# Patient Record
Sex: Female | Born: 1995 | Race: Black or African American | Hispanic: No | Marital: Single | Smoking: Current some day smoker
Health system: Southern US, Community
[De-identification: ages and names within clinical notes are randomized; demographics above are authoritative.]

## PROBLEM LIST (undated history)

## (undated) DIAGNOSIS — J982 Interstitial emphysema: Secondary | ICD-10-CM

## (undated) DIAGNOSIS — F1010 Alcohol abuse, uncomplicated: Secondary | ICD-10-CM

## (undated) DIAGNOSIS — F319 Bipolar disorder, unspecified: Secondary | ICD-10-CM

## (undated) DIAGNOSIS — F4310 Post-traumatic stress disorder, unspecified: Secondary | ICD-10-CM

## (undated) HISTORY — DX: Alcohol abuse, uncomplicated: F10.10

## (undated) HISTORY — DX: Interstitial emphysema: J98.2

---
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
## 2020-10-17 ENCOUNTER — Ambulatory Visit (HOSPITAL_COMMUNITY)
Admission: EM | Admit: 2020-10-17 | Discharge: 2020-10-17 | Disposition: A | Discharge status: Home / Self Care | Payer: Self-pay | Attending: Family Medicine | Admitting: Family Medicine

## 2020-10-17 ENCOUNTER — Encounter (HOSPITAL_COMMUNITY): Payer: Self-pay | Admitting: Emergency Medicine

## 2020-10-17 DIAGNOSIS — N898 Other specified noninflammatory disorders of vagina: Secondary | ICD-10-CM | POA: Insufficient documentation

## 2020-10-17 DIAGNOSIS — N760 Acute vaginitis: Secondary | ICD-10-CM | POA: Insufficient documentation

## 2020-10-17 LAB — HIV ANTIBODY (ROUTINE TESTING W REFLEX): HIV Screen 4th Generation wRfx: NONREACTIVE

## 2020-10-17 MED ORDER — HIBICLENS 4 % EX LIQD: Freq: Every day | CUTANEOUS | 0 refills | Status: DC | PRN

## 2020-10-17 MED ORDER — LIDOCAINE VISCOUS HCL 2 % MT SOLN: OROMUCOSAL | 0 refills | Status: DC

## 2020-10-17 NOTE — ED Triage Notes (Signed)
Pt presents with vaginal pain, itching and burnign. States has white spots and "ulcers" in vaginal area. Denies discharge.

## 2020-10-17 NOTE — ED Provider Notes (Signed)
[facility]    CSN: [id number] Arrival date & time: 10/17/20  1208      History   Chief Complaint Chief Complaint  Patient presents with  . Vaginitis    HPI Diane Dickson is a 24 y.o. female.   Presenting today with vaginal sores, irritation and discharge for the past 3 days or so. States this has happened once before and eventually with good hygiene they went away. Denies known hx of STIs, new exposures. Has not tried anything OTC for sxs. Denies fever, chills, abdominal pain, flank pain, urinary sxs.      History reviewed. No pertinent past medical history.  There are no problems to display for this patient.   History reviewed. No pertinent surgical history.  OB History   No obstetric history on file.      Home Medications    Prior to Admission medications   Medication Sig Start Date End Date Taking? Authorizing Provider  chlorhexidine (HIBICLENS) 4 % external liquid Apply topically daily as needed. 10/17/20   Particia Nearing, PA-C  lidocaine (XYLOCAINE) 2 % solution Dab with cotton swab to external areas of pain in vaginal area as needed 10/17/20   Particia Nearing, PA-C    Family History Family History  Problem Relation Age of Onset  . Healthy Mother     Social History Social History   Tobacco Use  . Smoking status: Never Smoker  . Smokeless tobacco: Never Used  Substance Use Topics  . Alcohol use: Yes    Comment: daily  . Drug use: Never     Allergies   Patient has no allergy information on record.   Review of Systems Review of Systems PER HPI    Physical Exam Triage Vital Signs ED Triage Vitals  Enc Vitals Group     BP 10/17/20 1314 (!) 153/105     Pulse Rate 10/17/20 1314 74     Resp 10/17/20 1314 16     Temp 10/17/20 1314 98.9 F (37.2 C)     Temp Source 10/17/20 1314 Oral     SpO2 10/17/20 1314 97 %     Weight 10/17/20 1312 140 lb (63.5 kg)     Height 10/17/20 1312 5\' 1"  (1.549 m)     Head  Circumference --      Peak Flow --      Pain Score 10/17/20 1311 8     Pain Loc --      Pain Edu? --      Excl. in GC? --    No data found.  Updated Vital Signs BP (!) 153/105 (BP Location: Left Arm)   Pulse 74   Temp 98.9 F (37.2 C) (Oral)   Resp 16   Ht 5\' 1"  (1.549 m)   Wt 140 lb (63.5 kg)   LMP 09/25/2020   SpO2 97%   BMI 26.45 kg/m   Visual Acuity Right Eye Distance:   Left Eye Distance:   Bilateral Distance:    Right Eye Near:   Left Eye Near:    Bilateral Near:     Physical Exam Vitals and nursing note reviewed. Exam conducted with a chaperone present.  Constitutional:      Appearance: Normal appearance. She is not ill-appearing.  HENT:     Head: Atraumatic.  Eyes:     Extraocular Movements: Extraocular movements intact.     Conjunctiva/sclera: Conjunctivae normal.  Cardiovascular:     Rate and Rhythm: Normal rate and regular rhythm.  Heart sounds: Normal heart sounds.  Pulmonary:     Effort: Pulmonary effort is normal.     Breath sounds: Normal breath sounds.  Abdominal:     General: Bowel sounds are normal. There is no distension.     Palpations: Abdomen is soft.     Tenderness: There is no abdominal tenderness. There is no guarding.  Genitourinary:    Comments: Several pustules on gluteal and inner thigh/groin fold areas, several ulcerations on perineal and external labial regions b/l and internal vaginal canal erythematous, significantly tender to speculum exam. Mild discharge present Musculoskeletal:        General: Normal range of motion.     Cervical back: Normal range of motion and neck supple.  Skin:    General: Skin is warm and dry.  Neurological:     Mental Status: She is alert and oriented to person, place, and time.  Psychiatric:        Mood and Affect: Mood normal.        Thought Content: Thought content normal.        Judgment: Judgment normal.      UC Treatments / Results  Labs (all labs ordered are listed, but only  abnormal results are displayed) Labs Reviewed  HSV CULTURE AND TYPING  HIV ANTIBODY (ROUTINE TESTING W REFLEX)  RPR  CERVICOVAGINAL ANCILLARY ONLY    EKG   Radiology No results found.  Procedures Procedures (including critical care time)  Medications Ordered in UC Medications - No data to display  Initial Impression / Assessment and Plan / UC Course  I have reviewed the triage vital signs and the nursing notes.  Pertinent labs & imaging results that were available during my care of the patient were reviewed by me and considered in my medical decision making (see chart for details).     Full sti panel and HSV culture sent, will treat external pustules with hibiclens, warm compresses, good hygiene and lidocaine soln sent for prn pain relief. Return precautions reviewed  Final Clinical Impressions(s) / UC Diagnoses   Final diagnoses:  Acute vaginitis  Vaginal lesion   Discharge Instructions   None    ED Prescriptions    Medication Sig Dispense Auth. Provider   chlorhexidine (HIBICLENS) 4 % external liquid Apply topically daily as needed. 120 mL Particia Nearing, PA-C   lidocaine (XYLOCAINE) 2 % solution Dab with cotton swab to external areas of pain in vaginal area as needed 100 mL Particia Nearing, PA-C     PDMP not reviewed this encounter.   Particia Nearing, [place] 10/17/20 1437

## 2020-10-18 LAB — CERVICOVAGINAL ANCILLARY ONLY
Bacterial Vaginitis (gardnerella): POSITIVE — AB
Candida Glabrata: NEGATIVE
Candida Vaginitis: NEGATIVE
Chlamydia: NEGATIVE
Comment: NEGATIVE
Comment: NEGATIVE
Comment: NEGATIVE
Comment: NEGATIVE
Comment: NEGATIVE
Comment: NORMAL
Neisseria Gonorrhea: NEGATIVE
Trichomonas: NEGATIVE

## 2020-10-18 LAB — RPR: RPR Ser Ql: NONREACTIVE

## 2020-10-19 ENCOUNTER — Telehealth (HOSPITAL_COMMUNITY): Payer: Self-pay | Admitting: Emergency Medicine

## 2020-10-19 MED ORDER — METRONIDAZOLE 500 MG PO TABS: 500.0000 mg | ORAL_TABLET | Freq: Two times a day (BID) | ORAL | 0 refills | Status: DC

## 2020-10-20 ENCOUNTER — Telehealth (HOSPITAL_COMMUNITY): Payer: Self-pay

## 2020-10-20 LAB — HSV CULTURE AND TYPING

## 2020-10-20 MED ORDER — VALACYCLOVIR HCL 1 G PO TABS: 1000.0000 mg | ORAL_TABLET | Freq: Two times a day (BID) | ORAL | 0 refills | Status: AC

## 2020-10-20 NOTE — Telephone Encounter (Signed)
Pt called concerning if the medication we gave her would cause her to have vaginal bleeding. This RN advised her that it would not, the sores that are present could lead some blood tinged fluid present. Patient states that the blood is coming from her vagina and is heavy. This RN advised her that if she is soaking through a tampon or pad every hour, feeling fatigue, weakness, or dizziness she needs to go to the Emergency Department and she should call her OBGYN regarding the bleeding she is experiencing.   PT is positive for HSV2, this RN will call in medication per protocol.

## 2020-12-03 ENCOUNTER — Encounter (HOSPITAL_COMMUNITY): Payer: Self-pay | Admitting: Emergency Medicine

## 2020-12-03 ENCOUNTER — Emergency Department (HOSPITAL_COMMUNITY)
Admission: EM | Admit: 2020-12-03 | Discharge: 2020-12-03 | Disposition: A | Discharge status: Home / Self Care | Payer: Self-pay | Attending: Emergency Medicine | Admitting: Emergency Medicine

## 2020-12-03 ENCOUNTER — Other Ambulatory Visit: Payer: Self-pay

## 2020-12-03 DIAGNOSIS — R748 Abnormal levels of other serum enzymes: Secondary | ICD-10-CM | POA: Insufficient documentation

## 2020-12-03 DIAGNOSIS — F10929 Alcohol use, unspecified with intoxication, unspecified: Secondary | ICD-10-CM | POA: Insufficient documentation

## 2020-12-03 DIAGNOSIS — F10920 Alcohol use, unspecified with intoxication, uncomplicated: Secondary | ICD-10-CM

## 2020-12-03 DIAGNOSIS — Z20822 Contact with and (suspected) exposure to covid-19: Secondary | ICD-10-CM | POA: Insufficient documentation

## 2020-12-03 DIAGNOSIS — R45851 Suicidal ideations: Secondary | ICD-10-CM | POA: Insufficient documentation

## 2020-12-03 LAB — COMPREHENSIVE METABOLIC PANEL
ALT: 231 U/L — ABNORMAL HIGH (ref 0–44)
AST: 226 U/L — ABNORMAL HIGH (ref 15–41)
Albumin: 3.9 g/dL (ref 3.5–5.0)
Alkaline Phosphatase: 55 U/L (ref 38–126)
Anion gap: 14 (ref 5–15)
BUN: <5 mg/dL — ABNORMAL LOW (ref 6–20)
CO2: 29 mmol/L (ref 22–32)
Calcium: 9 mg/dL (ref 8.9–10.3)
Chloride: 99 mmol/L (ref 98–111)
Creatinine, Ser: 0.78 mg/dL (ref 0.44–1.00)
GFR, Estimated: >60 mL/min (ref >60)
Glucose, Bld: 103 mg/dL — ABNORMAL HIGH (ref 70–99)
Potassium: 3 mmol/L — ABNORMAL LOW (ref 3.5–5.1)
Sodium: 142 mmol/L (ref 135–145)
Total Bilirubin: 0.4 mg/dL (ref 0.3–1.2)
Total Protein: 8.4 g/dL — ABNORMAL HIGH (ref 6.5–8.1)

## 2020-12-03 LAB — CBC
HCT: 41.6 % (ref 36.0–46.0)
Hemoglobin: 14.1 g/dL (ref 12.0–15.0)
MCH: 33.3 pg (ref 26.0–34.0)
MCHC: 33.9 g/dL (ref 30.0–36.0)
MCV: 98.3 fL (ref 80.0–100.0)
Platelets: 232 10*3/uL (ref 150–400)
RBC: 4.23 MIL/uL (ref 3.87–5.11)
RDW: 13.2 % (ref 11.5–15.5)
WBC: 5.5 10*3/uL (ref 4.0–10.5)
nRBC: 0 % (ref 0.0–0.2)

## 2020-12-03 LAB — RAPID URINE DRUG SCREEN, HOSP PERFORMED
Amphetamines: NOT DETECTED
Barbiturates: NOT DETECTED
Benzodiazepines: NOT DETECTED
Cocaine: NOT DETECTED
Opiates: NOT DETECTED
Tetrahydrocannabinol: NOT DETECTED

## 2020-12-03 LAB — HEPATITIS PANEL, ACUTE
HCV Ab: NONREACTIVE
Hep A IgM: NONREACTIVE
Hep B C IgM: NONREACTIVE
Hepatitis B Surface Ag: NONREACTIVE

## 2020-12-03 LAB — I-STAT BETA HCG BLOOD, ED (MC, WL, AP ONLY): I-stat hCG, quantitative: <5 m[IU]/mL (ref <5)

## 2020-12-03 LAB — ACETAMINOPHEN LEVEL: Acetaminophen (Tylenol), Serum: <10 ug/mL — ABNORMAL LOW (ref 10–30)

## 2020-12-03 LAB — RESP PANEL BY RT-PCR (FLU A&B, COVID) ARPGX2
Influenza A by PCR: NEGATIVE
Influenza B by PCR: NEGATIVE
SARS Coronavirus 2 by RT PCR: NEGATIVE

## 2020-12-03 LAB — SALICYLATE LEVEL: Salicylate Lvl: <7 mg/dL — ABNORMAL LOW (ref 7.0–30.0)

## 2020-12-03 LAB — ETHANOL: Alcohol, Ethyl (B): 516 mg/dL (ref <10)

## 2020-12-03 MED ORDER — THIAMINE HCL 100 MG/ML IJ SOLN
Freq: Once | INTRAVENOUS | Status: AC
  Filled 2020-12-03: qty 1000

## 2020-12-03 MED ORDER — SODIUM CHLORIDE 0.9 % IV BOLUS: 1000.0000 mL | Freq: Once | INTRAVENOUS | Status: DC

## 2020-12-03 MED ORDER — POTASSIUM CHLORIDE CRYS ER 20 MEQ PO TBCR
40.0000 meq | EXTENDED_RELEASE_TABLET | Freq: Once | ORAL | Status: AC
  Administered 2020-12-03: 40 meq via ORAL
  Filled 2020-12-03: qty 2

## 2020-12-03 MED ORDER — SODIUM CHLORIDE 0.9 % IV BOLUS
1000.0000 mL | Freq: Once | INTRAVENOUS | Status: AC
  Administered 2020-12-03: 1000 mL via INTRAVENOUS

## 2020-12-03 NOTE — ED Notes (Signed)
Diane Dickson [phone] asking to be contacted with updates.

## 2020-12-03 NOTE — Discharge Instructions (Addendum)
As discussed, your liver enzymes were elevated today. Please have them rechecked in a week by a primary care provider. I have included resources in the community to help with your alcohol consumption. Peer support should be contacting you as well for additional resources. Return to the ER for new or worsening symptoms.

## 2020-12-03 NOTE — ED Triage Notes (Addendum)
Patient brought in by friends after ETOH consumption with patient expressing suicidal ideations with plan to overdose on medications. Patient reports a friend recently committed suicide. Patient is currently intoxicated.

## 2020-12-03 NOTE — ED Notes (Signed)
Pt speaking to mother. Will continue to monitor

## 2020-12-03 NOTE — ED Provider Notes (Signed)
EtOH, hx of abuse. Friend dropped her off due to SI, however denies SI on evaluation. Plan to re-evaluate once metabolized for any persisting SI. If negative, can discharge. Physical Exam  BP 125/90   Pulse (!) 111   Temp 98.4 F (36.9 C) (Oral)   Resp 18   Ht 5' (1.524 m)   Wt 59 kg   SpO2 100%   BMI 25.39 kg/m   Physical Exam Vitals and nursing note reviewed.  Constitutional:      Appearance: She is well-developed and well-nourished.  HENT:     Head: Normocephalic and atraumatic.  Eyes:     Conjunctiva/sclera: Conjunctivae normal.  Cardiovascular:     Rate and Rhythm: Normal rate.  Pulmonary:     Effort: Pulmonary effort is normal.  Abdominal:     Palpations: Abdomen is soft.  Skin:    General: Skin is warm.  Neurological:     Mental Status: She is alert and oriented to person, place, and time.     Comments: Steady gait  Psychiatric:        Mood and Affect: Mood and affect normal.        Behavior: Behavior normal.     ED Course/Procedures   Clinical Course as of 12/03/20 1819  Fri Dec 03, 2020  1507 Alcohol, Ethyl (B)(!!): 516 [CA]  1507 AST(!): 226 [CA]  1507 ALT(!): 231 [CA]  1545 Patient visualized ambulating in the hallway with steady gait, though still disoriented [JR]    Clinical Course User Index [CA] Mannie Stabile, PA-C [JR] Sayre Witherington, [place] N, PA-C    Procedures  MDM  On reevaluation, patient is alert and oriented.  She has normal speech and behavior.  She states she was unaware that she was expressing suicidal ideations.  She does not feel suicidal or homicidal.  She states it must of been the alcohol intoxication.  She endorses daily heavy drinking.  She states some of her "party friends" brought her here.  She does not wish to complete banana bag or wait for hepatitis panel results.   Had long discussion with patient regarding alcohol abuse.  Discussed her transaminitis, as this is likely secondary to her heavy alcohol use.  Patient  states she was unaware that her blood work could show evidence of her drinking habits.  Discussed these concerning findings and concern for her health with her alcohol dependence and potential future complications with her liver if she continues the heavy alcohol use.  Patient asked if it was "too late to change."  Provided additional counseling.  Consult placed to peer support and she is given substance abuse counseling resources in her discharge paperwork.  Discussed potential dangers associated with acute alcohol withdrawal and recommendation to gradually taper down her daily usage of alcohol.  She is discharged in no acute distress with steady gait.  PCP referral provided.    Murphy Bundick, [place] N, PA-C 12/03/20 Silva Bandy    Rolan Bucco, MD 12/06/20 [phone]

## 2020-12-03 NOTE — ED Provider Notes (Signed)
[facility] EMERGENCY DEPARTMENT Provider Note   CSN: [id number] Arrival date & time: 12/03/20  1218     History Chief Complaint  Patient presents with  . Medical Clearance    Diane Dickson is a 25 y.o. female with no significant past medical history presents to the ED due to suicidal ideations.  Patient brought in by friend after alcohol consumption due to thoughts of suicide.  Patient told friend that she planned on overdosing on medications.  Patient intoxicated during my initial evaluation, so history is limited.  Patient denies SI, HI, auditory/visual hallucinations.  Patient states she drinks "a lot" of alcohol daily.  She drank alcohol earlier this morning however, is unable to tell me how much.  No documented mental illnesses per chart review.  Per triage note, patient's friend recently committed suicide.  Denies any physical complaints.  No aggravating or alleviating factors  History obtained from patient and past medical records. No interpreter used during encounter.      History reviewed. No pertinent past medical history.  There are no problems to display for this patient.   History reviewed. No pertinent surgical history.   OB History   No obstetric history on file.     Family History  Problem Relation Age of Onset  . Healthy Mother     Social History   Tobacco Use  . Smoking status: Never Smoker  . Smokeless tobacco: Never Used  Substance Use Topics  . Alcohol use: Yes    Comment: daily  . Drug use: Never    Home Medications Prior to Admission medications   Medication Sig Start Date End Date Taking? Authorizing Provider  chlorhexidine (HIBICLENS) 4 % external liquid Apply topically daily as needed. 10/17/20   Particia Nearing, PA-C  lidocaine (XYLOCAINE) 2 % solution Dab with cotton swab to external areas of pain in vaginal area as needed 10/17/20   Particia Nearing, PA-C  metroNIDAZOLE (FLAGYL) 500 MG tablet Take 1  tablet (500 mg total) by mouth 2 (two) times daily. 10/19/20   Lamptey, Britta Mccreedy, MD    Allergies    Patient has no known allergies.  Review of Systems   Review of Systems  Constitutional: Negative for chills and fever.  HENT: Negative for rhinorrhea and sore throat.   Eyes: Negative for visual disturbance.  Respiratory: Negative for shortness of breath.   Cardiovascular: Negative for chest pain and palpitations.  Gastrointestinal: Negative for abdominal pain.  Genitourinary: Negative for dysuria.  Musculoskeletal: Negative for myalgias.  Skin: Negative for color change and rash.  Neurological: Negative for dizziness and light-headedness.  Psychiatric/Behavioral: Positive for suicidal ideas. Negative for hallucinations and self-injury.    Physical Exam Updated Vital Signs BP 125/90   Pulse (!) 111   Temp 98.4 F (36.9 C) (Oral)   Resp 18   Ht 5' (1.524 m)   Wt 59 kg   SpO2 100%   BMI 25.39 kg/m   Physical Exam Vitals and nursing note reviewed.  Constitutional:      General: She is not in acute distress.    Appearance: She is not ill-appearing.     Comments: Appears to be intoxicated  HENT:     Head: Normocephalic.  Eyes:     Pupils: Pupils are equal, round, and reactive to light.  Cardiovascular:     Rate and Rhythm: Normal rate and regular rhythm.     Pulses: Normal pulses.     Heart sounds: Normal heart sounds.  No murmur heard. No friction rub. No gallop.   Pulmonary:     Effort: Pulmonary effort is normal.     Breath sounds: Normal breath sounds.  Abdominal:     General: Abdomen is flat. Bowel sounds are normal. There is no distension.     Palpations: Abdomen is soft.     Tenderness: There is no abdominal tenderness. There is no guarding or rebound.  Musculoskeletal:        General: Normal range of motion.     Cervical back: Neck supple.  Skin:    General: Skin is warm and dry.  Neurological:     General: No focal deficit present.  Psychiatric:         Thought Content: Thought content includes suicidal ideation.     ED Results / Procedures / Treatments   Labs (all labs ordered are listed, but only abnormal results are displayed) Labs Reviewed  COMPREHENSIVE METABOLIC PANEL - Abnormal; Notable for the following components:      Result Value   Potassium 3.0 (*)    Glucose, Bld 103 (*)    BUN <5 (*)    Total Protein 8.4 (*)    AST 226 (*)    ALT 231 (*)    All other components within normal limits  ETHANOL - Abnormal; Notable for the following components:   Alcohol, Ethyl (B) 516 (*)    All other components within normal limits  SALICYLATE LEVEL - Abnormal; Notable for the following components:   Salicylate Lvl <7.0 (*)    All other components within normal limits  ACETAMINOPHEN LEVEL - Abnormal; Notable for the following components:   Acetaminophen (Tylenol), Serum <10 (*)    All other components within normal limits  RESP PANEL BY RT-PCR (FLU A&B, COVID) ARPGX2  CBC  RAPID URINE DRUG SCREEN, HOSP PERFORMED  HEPATITIS PANEL, ACUTE  I-STAT BETA HCG BLOOD, ED ([facility], [facility], [facility] ONLY)    EKG None  Radiology No results found.  Procedures Procedures (including critical care time)  Medications Ordered in ED Medications  potassium chloride SA (KLOR-CON) CR tablet 40 mEq (has no administration in time range)  sodium chloride 0.9 % 1,000 mL with thiamine 100 mg, folic acid 1 mg, multivitamins adult 10 mL infusion (has no administration in time range)  sodium chloride 0.9 % bolus 1,000 mL (1,000 mLs Intravenous New Bag/Given 12/03/20 1412)    ED Course  I have reviewed the triage vital signs and the nursing notes.  Pertinent labs & imaging results that were available during my care of the patient were reviewed by me and considered in my medical decision making (see chart for details).  Clinical Course as of 12/03/20 1527  Fri Dec 03, 2020  1507 Alcohol, Ethyl (B)(!!): 516 [CA]  1507 AST(!): 226 [CA]  1507 ALT(!): 231 [CA]     Clinical Course User Index [CA] Audelia Acton Merla Riches, PA-C   MDM Rules/Calculators/A&P                         25 year old female who presents to the ED after suicidal ideations with a plan to overdose on medications.  No previous psych history.  Patient admits to daily alcohol consumption and is visibly intoxicated during my initial evaluation.  Patient is here voluntarily.  Stable vitals.  Patient mildly tachycardic likely due to intoxication.  IV fluids given.  Medical clearance labs ordered.  CBC unremarkable no leukocytosis and normal hemoglobin.  Negative pregnancy test.  Ethanol level elevated at 516.  Acetaminophen and salicylate level within normal limits.  CBC unremarkable no leukocytosis and normal hemoglobin.  CMP significant for hypokalemia 3.  Potassium repleted here in the ED.  AST and ALT elevated (AST 226; ALT 231). Patient has no RUQ tenderness.  Low suspicion for gallbladder etiology.  Hepatitis panel ordered.  We will have patient follow-up with PCP to recheck LFTs to ensure they are downtrending.  Patient handed off to [place], PA-C at shift change pending clinical sobriety. Once patient is sober enough to give a history, inquire about SI/HI and consult TTS as needed.  Final Clinical Impression(s) / ED Diagnoses Final diagnoses:  Alcoholic intoxication without complication (HCC)  Suicidal ideations  Elevated liver enzymes    Rx / DC Orders ED Discharge Orders    None       Jesusita Oka 12/03/20 1527    Rolan Bucco, MD 12/06/20 [phone]

## 2020-12-03 NOTE — ED Notes (Signed)
Pt urinated in triage room floor while putting on scrubs then sit in same. Assisted pt in putting on depends and changing. Pt very unsteady while standing.

## 2021-01-03 ENCOUNTER — Other Ambulatory Visit: Payer: Self-pay

## 2021-01-03 ENCOUNTER — Emergency Department (HOSPITAL_COMMUNITY): Payer: Self-pay

## 2021-01-03 ENCOUNTER — Inpatient Hospital Stay (HOSPITAL_COMMUNITY)
Admission: EM | Admit: 2021-01-03 | Discharge: 2021-01-07 | DRG: 199 | Disposition: A | Discharge status: Home / Self Care | Payer: Self-pay | Attending: Internal Medicine | Admitting: Internal Medicine

## 2021-01-03 ENCOUNTER — Encounter (HOSPITAL_COMMUNITY): Payer: Self-pay

## 2021-01-03 ENCOUNTER — Inpatient Hospital Stay (HOSPITAL_COMMUNITY): Payer: Self-pay

## 2021-01-03 DIAGNOSIS — U071 COVID-19: Secondary | ICD-10-CM | POA: Diagnosis present

## 2021-01-03 DIAGNOSIS — F10230 Alcohol dependence with withdrawal, uncomplicated: Secondary | ICD-10-CM

## 2021-01-03 DIAGNOSIS — F1290 Cannabis use, unspecified, uncomplicated: Secondary | ICD-10-CM | POA: Diagnosis present

## 2021-01-03 DIAGNOSIS — F10939 Alcohol use, unspecified with withdrawal, unspecified: Secondary | ICD-10-CM

## 2021-01-03 DIAGNOSIS — E162 Hypoglycemia, unspecified: Secondary | ICD-10-CM | POA: Diagnosis not present

## 2021-01-03 DIAGNOSIS — I319 Disease of pericardium, unspecified: Secondary | ICD-10-CM | POA: Diagnosis present

## 2021-01-03 DIAGNOSIS — Y900 Blood alcohol level of less than 20 mg/100 ml: Secondary | ICD-10-CM | POA: Diagnosis present

## 2021-01-03 DIAGNOSIS — R197 Diarrhea, unspecified: Secondary | ICD-10-CM | POA: Diagnosis not present

## 2021-01-03 DIAGNOSIS — N179 Acute kidney failure, unspecified: Secondary | ICD-10-CM | POA: Diagnosis present

## 2021-01-03 DIAGNOSIS — F10239 Alcohol dependence with withdrawal, unspecified: Secondary | ICD-10-CM | POA: Diagnosis present

## 2021-01-03 DIAGNOSIS — E872 Acidosis, unspecified: Secondary | ICD-10-CM

## 2021-01-03 DIAGNOSIS — J982 Interstitial emphysema: Principal | ICD-10-CM | POA: Diagnosis present

## 2021-01-03 DIAGNOSIS — I10 Essential (primary) hypertension: Secondary | ICD-10-CM | POA: Diagnosis present

## 2021-01-03 LAB — ACETAMINOPHEN LEVEL: Acetaminophen (Tylenol), Serum: <10 ug/mL — ABNORMAL LOW (ref 10–30)

## 2021-01-03 LAB — RESP PANEL BY RT-PCR (FLU A&B, COVID) ARPGX2
Influenza A by PCR: NEGATIVE
Influenza B by PCR: NEGATIVE
SARS Coronavirus 2 by RT PCR: POSITIVE — AB

## 2021-01-03 LAB — CBC WITH DIFFERENTIAL/PLATELET
Abs Immature Granulocytes: 0.06 10*3/uL (ref 0.00–0.07)
Basophils Absolute: 0 10*3/uL (ref 0.0–0.1)
Basophils Relative: 0 %
Eosinophils Absolute: 0 10*3/uL (ref 0.0–0.5)
Eosinophils Relative: 0 %
HCT: 53.7 % — ABNORMAL HIGH (ref 36.0–46.0)
Hemoglobin: 17.4 g/dL — ABNORMAL HIGH (ref 12.0–15.0)
Immature Granulocytes: 1 %
Lymphocytes Relative: 1 %
Lymphs Abs: 0.2 10*3/uL — ABNORMAL LOW (ref 0.7–4.0)
MCH: 34 pg (ref 26.0–34.0)
MCHC: 32.4 g/dL (ref 30.0–36.0)
MCV: 104.9 fL — ABNORMAL HIGH (ref 80.0–100.0)
Monocytes Absolute: 0.4 10*3/uL (ref 0.1–1.0)
Monocytes Relative: 3 %
Neutro Abs: 11.5 10*3/uL — ABNORMAL HIGH (ref 1.7–7.7)
Neutrophils Relative %: 95 %
Platelets: 209 10*3/uL (ref 150–400)
RBC: 5.12 MIL/uL — ABNORMAL HIGH (ref 3.87–5.11)
RDW: 13.4 % (ref 11.5–15.5)
WBC: 12.2 10*3/uL — ABNORMAL HIGH (ref 4.0–10.5)
nRBC: 0 % (ref 0.0–0.2)

## 2021-01-03 LAB — URINALYSIS, ROUTINE W REFLEX MICROSCOPIC
Bilirubin Urine: NEGATIVE
Glucose, UA: 50 mg/dL — AB
Ketones, ur: 80 mg/dL — AB
Leukocytes,Ua: NEGATIVE
Nitrite: NEGATIVE
Protein, ur: >=300 mg/dL — AB
Specific Gravity, Urine: 1.038 — ABNORMAL HIGH (ref 1.005–1.030)
pH: 6 (ref 5.0–8.0)

## 2021-01-03 LAB — RAPID URINE DRUG SCREEN, HOSP PERFORMED
Amphetamines: NOT DETECTED
Barbiturates: NOT DETECTED
Benzodiazepines: NOT DETECTED
Cocaine: NOT DETECTED
Opiates: NOT DETECTED
Tetrahydrocannabinol: NOT DETECTED

## 2021-01-03 LAB — BASIC METABOLIC PANEL
Anion gap: 27 — ABNORMAL HIGH (ref 5–15)
BUN: 12 mg/dL (ref 6–20)
CO2: 9 mmol/L — ABNORMAL LOW (ref 22–32)
Calcium: 10.3 mg/dL (ref 8.9–10.3)
Chloride: 99 mmol/L (ref 98–111)
Creatinine, Ser: 1.24 mg/dL — ABNORMAL HIGH (ref 0.44–1.00)
GFR, Estimated: >60 mL/min (ref >60)
Glucose, Bld: 212 mg/dL — ABNORMAL HIGH (ref 70–99)
Potassium: 5.2 mmol/L — ABNORMAL HIGH (ref 3.5–5.1)
Sodium: 135 mmol/L (ref 135–145)

## 2021-01-03 LAB — BLOOD GAS, VENOUS
Acid-base deficit: 18.1 mmol/L — ABNORMAL HIGH (ref 0.0–2.0)
Bicarbonate: 10.1 mmol/L — ABNORMAL LOW (ref 20.0–28.0)
O2 Saturation: 44.9 %
Patient temperature: 98.6
pCO2, Ven: 30.3 mmHg — ABNORMAL LOW (ref 44.0–60.0)
pH, Ven: 7.151 — CL (ref 7.250–7.430)
pO2, Ven: <31 mmHg — CL (ref 32.0–45.0)

## 2021-01-03 LAB — APTT: aPTT: 32 seconds (ref 24–36)

## 2021-01-03 LAB — SARS CORONAVIRUS 2 (TAT 6-24 HRS): SARS Coronavirus 2: POSITIVE — AB

## 2021-01-03 LAB — HEPATIC FUNCTION PANEL
ALT: 61 U/L — ABNORMAL HIGH (ref 0–44)
AST: 75 U/L — ABNORMAL HIGH (ref 15–41)
Albumin: 5.5 g/dL — ABNORMAL HIGH (ref 3.5–5.0)
Alkaline Phosphatase: 62 U/L (ref 38–126)
Bilirubin, Direct: 0.2 mg/dL (ref 0.0–0.2)
Indirect Bilirubin: 1.4 mg/dL — ABNORMAL HIGH (ref 0.3–0.9)
Total Bilirubin: 1.6 mg/dL — ABNORMAL HIGH (ref 0.3–1.2)
Total Protein: 10.9 g/dL — ABNORMAL HIGH (ref 6.5–8.1)

## 2021-01-03 LAB — PROTIME-INR
INR: 1.2 (ref 0.8–1.2)
Prothrombin Time: 14.3 seconds (ref 11.4–15.2)

## 2021-01-03 LAB — BLOOD GAS, ARTERIAL
Acid-base deficit: 15.9 mmol/L — ABNORMAL HIGH (ref 0.0–2.0)
Bicarbonate: 9.4 mmol/L — ABNORMAL LOW (ref 20.0–28.0)
Drawn by: 25788
O2 Saturation: 97.3 %
Patient temperature: 98.6
pCO2 arterial: 21.3 mmHg — ABNORMAL LOW (ref 32.0–48.0)
pH, Arterial: 7.267 — ABNORMAL LOW (ref 7.350–7.450)
pO2, Arterial: 110 mmHg — ABNORMAL HIGH (ref 83.0–108.0)

## 2021-01-03 LAB — AMMONIA: Ammonia: 71 umol/L — ABNORMAL HIGH (ref 9–35)

## 2021-01-03 LAB — TROPONIN I (HIGH SENSITIVITY): Troponin I (High Sensitivity): 4 ng/L (ref <18)

## 2021-01-03 LAB — HCG, QUANTITATIVE, PREGNANCY: hCG, Beta Chain, Quant, S: <1 m[IU]/mL (ref <5)

## 2021-01-03 LAB — I-STAT BETA HCG BLOOD, ED (MC, WL, AP ONLY): I-stat hCG, quantitative: <5 m[IU]/mL (ref <5)

## 2021-01-03 LAB — SALICYLATE LEVEL: Salicylate Lvl: <7 mg/dL — ABNORMAL LOW (ref 7.0–30.0)

## 2021-01-03 LAB — OSMOLALITY: Osmolality: 307 mOsm/kg — ABNORMAL HIGH (ref 275–295)

## 2021-01-03 LAB — ETHANOL: Alcohol, Ethyl (B): <10 mg/dL (ref <10)

## 2021-01-03 LAB — LACTIC ACID, PLASMA: Lactic Acid, Venous: 2.1 mmol/L (ref 0.5–1.9)

## 2021-01-03 LAB — MRSA PCR SCREENING: MRSA by PCR: NEGATIVE

## 2021-01-03 LAB — D-DIMER, QUANTITATIVE: D-Dimer, Quant: 0.98 ug/mL-FEU — ABNORMAL HIGH (ref 0.00–0.50)

## 2021-01-03 MED ORDER — SODIUM CHLORIDE 0.9 % IV BOLUS
1000.0000 mL | Freq: Once | INTRAVENOUS | Status: AC
  Administered 2021-01-03: 1000 mL via INTRAVENOUS

## 2021-01-03 MED ORDER — LORAZEPAM 2 MG/ML IJ SOLN
1.0000 mg | Freq: Once | INTRAMUSCULAR | Status: AC
  Administered 2021-01-03: 1 mg via INTRAVENOUS
  Filled 2021-01-03: qty 1

## 2021-01-03 MED ORDER — LABETALOL HCL 5 MG/ML IV SOLN
10.0000 mg | INTRAVENOUS | Status: DC | PRN
  Administered 2021-01-03 – 2021-01-06 (×5): 10 mg via INTRAVENOUS
  Filled 2021-01-03 (×4): qty 4

## 2021-01-03 MED ORDER — DOCUSATE SODIUM 100 MG PO CAPS: 100.0000 mg | ORAL_CAPSULE | Freq: Two times a day (BID) | ORAL | Status: DC | PRN

## 2021-01-03 MED ORDER — THIAMINE HCL 100 MG PO TABS
100.0000 mg | ORAL_TABLET | Freq: Every day | ORAL | Status: DC
  Administered 2021-01-03: 100 mg via ORAL
  Filled 2021-01-03: qty 1

## 2021-01-03 MED ORDER — LORAZEPAM 2 MG/ML IJ SOLN
1.0000 mg | INTRAMUSCULAR | Status: AC | PRN
  Administered 2021-01-03: 2 mg via INTRAVENOUS
  Administered 2021-01-03: 3 mg via INTRAVENOUS
  Administered 2021-01-05: 2 mg via INTRAVENOUS
  Administered 2021-01-06 (×2): 4 mg via INTRAVENOUS
  Filled 2021-01-03: qty 2
  Filled 2021-01-03: qty 1
  Filled 2021-01-03: qty 2
  Filled 2021-01-03 (×2): qty 1
  Filled 2021-01-03: qty 2

## 2021-01-03 MED ORDER — LORAZEPAM 2 MG/ML IJ SOLN
0.0000 mg | Freq: Four times a day (QID) | INTRAMUSCULAR | Status: DC
  Administered 2021-01-03: 1 mg via INTRAVENOUS
  Filled 2021-01-03: qty 1

## 2021-01-03 MED ORDER — POLYETHYLENE GLYCOL 3350 17 G PO PACK: 17.0000 g | PACK | Freq: Every day | ORAL | Status: DC | PRN

## 2021-01-03 MED ORDER — THIAMINE HCL 100 MG/ML IJ SOLN
Freq: Once | INTRAVENOUS | Status: AC
  Filled 2021-01-03: qty 1000

## 2021-01-03 MED ORDER — ADULT MULTIVITAMIN W/MINERALS CH
1.0000 | ORAL_TABLET | Freq: Every day | ORAL | Status: DC
  Administered 2021-01-05 – 2021-01-07 (×3): 1 via ORAL
  Filled 2021-01-03 (×3): qty 1

## 2021-01-03 MED ORDER — LORAZEPAM 2 MG/ML IJ SOLN
2.0000 mg | Freq: Once | INTRAMUSCULAR | Status: AC
  Administered 2021-01-03: 2 mg via INTRAVENOUS
  Filled 2021-01-03: qty 1

## 2021-01-03 MED ORDER — THIAMINE HCL 100 MG PO TABS
100.0000 mg | ORAL_TABLET | Freq: Every day | ORAL | Status: DC
  Administered 2021-01-06 – 2021-01-07 (×2): 100 mg via ORAL
  Filled 2021-01-03 (×2): qty 1

## 2021-01-03 MED ORDER — HEPARIN SODIUM (PORCINE) 5000 UNIT/ML IJ SOLN
5000.0000 [IU] | Freq: Three times a day (TID) | INTRAMUSCULAR | Status: DC
  Administered 2021-01-03 – 2021-01-07 (×10): 5000 [IU] via SUBCUTANEOUS
  Filled 2021-01-03 (×10): qty 1

## 2021-01-03 MED ORDER — ONDANSETRON 4 MG PO TBDP
4.0000 mg | ORAL_TABLET | Freq: Once | ORAL | Status: AC
  Administered 2021-01-03: 4 mg via ORAL
  Filled 2021-01-03: qty 1

## 2021-01-03 MED ORDER — VANCOMYCIN HCL 1500 MG/300ML IV SOLN
1500.0000 mg | Freq: Once | INTRAVENOUS | Status: AC
  Administered 2021-01-03: 1500 mg via INTRAVENOUS
  Filled 2021-01-03: qty 300

## 2021-01-03 MED ORDER — LORAZEPAM 2 MG/ML IJ SOLN: 0.0000 mg | Freq: Two times a day (BID) | INTRAMUSCULAR | Status: DC

## 2021-01-03 MED ORDER — PIPERACILLIN-TAZOBACTAM 3.375 G IVPB 30 MIN
3.3750 g | Freq: Once | INTRAVENOUS | Status: AC
  Administered 2021-01-03: 3.375 g via INTRAVENOUS
  Filled 2021-01-03: qty 50

## 2021-01-03 MED ORDER — LORAZEPAM 1 MG PO TABS: 1.0000 mg | ORAL_TABLET | ORAL | Status: AC | PRN

## 2021-01-03 MED ORDER — IOHEXOL 9 MG/ML PO SOLN: 500.0000 mL | ORAL | Status: AC

## 2021-01-03 MED ORDER — IOHEXOL 9 MG/ML PO SOLN
ORAL | Status: AC
  Administered 2021-01-03: 500 mL
  Filled 2021-01-03: qty 500

## 2021-01-03 MED ORDER — IOHEXOL 350 MG/ML SOLN
100.0000 mL | Freq: Once | INTRAVENOUS | Status: AC | PRN
  Administered 2021-01-03: 65 mL via INTRAVENOUS

## 2021-01-03 MED ORDER — LORAZEPAM 1 MG PO TABS: 0.0000 mg | ORAL_TABLET | Freq: Two times a day (BID) | ORAL | Status: DC

## 2021-01-03 MED ORDER — LORAZEPAM 1 MG PO TABS: 0.0000 mg | ORAL_TABLET | Freq: Four times a day (QID) | ORAL | Status: DC

## 2021-01-03 MED ORDER — DEXMEDETOMIDINE HCL IN NACL 200 MCG/50ML IV SOLN
0.4000 ug/kg/h | INTRAVENOUS | Status: DC
  Administered 2021-01-03: 0.4 ug/kg/h via INTRAVENOUS
  Administered 2021-01-04: 0.6 ug/kg/h via INTRAVENOUS
  Administered 2021-01-04: 0.5 ug/kg/h via INTRAVENOUS
  Filled 2021-01-03 (×4): qty 50

## 2021-01-03 MED ORDER — IOHEXOL 300 MG/ML  SOLN
100.0000 mL | Freq: Once | INTRAMUSCULAR | Status: AC | PRN
  Administered 2021-01-03: 100 mL via INTRAVENOUS

## 2021-01-03 MED ORDER — THIAMINE HCL 100 MG/ML IJ SOLN
100.0000 mg | Freq: Every day | INTRAMUSCULAR | Status: DC
  Administered 2021-01-04 – 2021-01-05 (×2): 100 mg via INTRAVENOUS
  Filled 2021-01-03 (×2): qty 2

## 2021-01-03 MED ORDER — ONDANSETRON HCL 4 MG/2ML IJ SOLN: 4.0000 mg | Freq: Three times a day (TID) | INTRAMUSCULAR | Status: DC | PRN

## 2021-01-03 MED ORDER — CHLORHEXIDINE GLUCONATE CLOTH 2 % EX PADS
6.0000 | MEDICATED_PAD | Freq: Every day | CUTANEOUS | Status: DC
  Administered 2021-01-03 – 2021-01-07 (×4): 6 via TOPICAL

## 2021-01-03 MED ORDER — THIAMINE HCL 100 MG/ML IJ SOLN: 100.0000 mg | Freq: Every day | INTRAMUSCULAR | Status: DC

## 2021-01-03 MED ORDER — SODIUM BICARBONATE 8.4 % IV SOLN
INTRAVENOUS | Status: DC
  Filled 2021-01-03 (×3): qty 150

## 2021-01-03 MED ORDER — FOLIC ACID 1 MG PO TABS
1.0000 mg | ORAL_TABLET | Freq: Every day | ORAL | Status: DC
  Administered 2021-01-05 – 2021-01-07 (×3): 1 mg via ORAL
  Filled 2021-01-03 (×3): qty 1

## 2021-01-03 NOTE — ED Notes (Signed)
Called report to [place], [place]

## 2021-01-03 NOTE — ED Notes (Signed)
Pt is unable to ambulate due to being lethargic. PA-C advised this writer to hold off before attempting to ambulate the pt.

## 2021-01-03 NOTE — ED Notes (Signed)
Pt urinated in the bed in an attempt to get out of bed to go the restroom. Pt has been cleaned up and placed on a purewick.

## 2021-01-03 NOTE — ED Notes (Signed)
Pt urinated in bed and on the floor. Pt am

## 2021-01-03 NOTE — Progress Notes (Signed)
eLink Physician-Brief Progress Note Patient Name: Diane Dickson DOB: [birth year] MRN: [id number]   Date of Service  01/03/2021  HPI/Events of Note  Hypertension - BP = 201/144 and HR = 128. Sinus Tachycardia. UDS negative for Cocaine.   eICU Interventions  Plan: 1. Labetalol 10 mg IV Q 2 hours PRN SBP > 170. Hold dose for HR < 60.      Intervention Category Major Interventions: Hypertension - evaluation and management  Trust Crago Eugene 01/03/2021, 9:10 PM

## 2021-01-03 NOTE — ED Triage Notes (Addendum)
Pt arrived via walk in, c/o sore throat, chills, generalized body aches x1 day. Denies any known sick contacts.

## 2021-01-03 NOTE — ED Provider Notes (Signed)
Patient here with ETOH withdrawal. 3 mg ativan Vitals:   01/03/21 1315 01/03/21 1316 01/03/21 1430 01/03/21 1500  BP:  (!) 136/99 (!) 169/129 (!) 158/114  Pulse: (!) 141 (!) 149 (!) 136 (!) 158  Resp: (!) 28 (!) 25 (!) 31 (!) 32  Temp:      TempSrc:      SpO2: 99% 99% 100% 100%  Weight:      Height:       Patient is tachypneic, tachycardic, hypertensive.  She admits confusion.  She states that she is trying to stop drinking alcohol and she quit drinking on Friday.  She has been having intermittent tactile hallucinations thinking that she has bugs crawling on her.  She feels extremely anxious.  .Critical Care Performed by: Arthor Captain, PA-C Authorized by: Arthor Captain, PA-C   Critical care provider statement:    Critical care time (minutes):  50   Critical care was necessary to treat or prevent imminent or life-threatening deterioration of the following conditions:  Metabolic crisis   Critical care was time spent personally by me on the following activities:  Discussions with consultants, evaluation of patient's response to treatment, examination of patient, ordering and performing treatments and interventions, ordering and review of laboratory studies, ordering and review of radiographic studies, pulse oximetry, re-evaluation of patient's condition, obtaining history from patient or surrogate and review of old charts   Care discussed with: admitting provider     Patient here with tachycardia, hypertension, elevated respiratory rate. I reviewed the patient's labs which shows elevated white blood cell count without fever, and AKI, hyper glycemia and high anion gap metabolic acidosis on the patient's metabolic panel. I ordered a hepatic function panel, lactic acid and VBG which all show elevated lactic as well as metabolic acidosis with a pH of 7.1. Differential diagnosis includes dehydration from vomiting, DKA, volatile alcohol ingestion, patient denies ingestion of other substances.  She does not appear to be on any new medications such as Metformin. The patient's chest x-ray read as plain however there is obvious air in the trapezius muscles bilaterally. CT scan I personally reviewed shows extensive pneumomediastinum with fluid. Patient may have bleb rupture or Boerhaave these syndrome. Given these findings I have consulted with critical care who will admit the patient. She remains persistently tachycardic and hypertensive which may be due to alcohol withdrawals and delirium tremens. Patient is critically ill and will require intensive treatment. I have covered her with broad-spectrum antibiotics including vancomycin and Zosyn for the pneumomediastinum. I ordered a CT abdomen with contrast for further evaluation of her vomiting.   Arthor Captain, PA-C 01/03/21 2011    Benjiman Core, MD 01/03/21 [phone]

## 2021-01-03 NOTE — Progress Notes (Signed)
eLink Physician-Brief Progress Note Patient Name: Diane Dickson DOB: 1996-06-12 MRN: [id number]   Date of Service  01/03/2021  HPI/Events of Note  Severe agitation - Trying to get OOB in spite of CIWA Ativan.   eICU Interventions  Plan: 1. Precedex IV infusion. Titrate to RASS = 0.      Intervention Category Major Interventions: Delirium, psychosis, severe agitation - evaluation and management  Lenell Antu 01/03/2021, 9:56 PM

## 2021-01-03 NOTE — ED Provider Notes (Signed)
[facility]-EMERGENCY DEPT Provider Note   CSN: [id number] Arrival date & time: 01/03/21  1144     History Chief Complaint  Patient presents with  . Sore Throat  . Generalized Body Aches    Diane Dickson is a 25 y.o. female with no significant past medical history presents to the ED due to nausea, vomiting, sore throat, myalgias, and shortness of breath.  Patient admits to greater than 10 episodes of nonbloody, nonbilious emesis yesterday.  Denies associated diarrhea and abdominal pain.  Diane Dickson admits to shortness of breath both at rest and with exertion.  Admits to chest pain located "below her rib cages" while coughing.  Denies fever and chills.  Diane Dickson is unvaccinated for COVID-19.  Denies sick contacts known Covid exposures.  No treatment prior to arrival.  Denies history of asthma.  Denies history of blood clots, recent surgeries, recent long immobilizations, hormonal treatments.  No lower extremity edema.  No aggravating or alleviating symptoms.  Patient admits to drinking heavily, roughly a whole "bottle" of liquor daily. Unable to specify if that bottle is a pint, half gallon, or gallon. Last alcoholic beverage Saturday. Diane Dickson admits to acute on chronic nausea and vomiting. Diane Dickson notes Diane Dickson will typically vomit after drinking on an empty stomach. Smokes marijuana occasionally. No other drugs.  History obtained from patient and past medical records. No interpreter used during encounter.      History reviewed. No pertinent past medical history.  There are no problems to display for this patient.   History reviewed. No pertinent surgical history.   OB History   No obstetric history on file.     Family History  Problem Relation Age of Onset  . Healthy Mother     Social History   Tobacco Use  . Smoking status: Never Smoker  . Smokeless tobacco: Never Used  Substance Use Topics  . Alcohol use: Yes    Comment: daily  . Drug use: Never    Home  Medications Prior to Admission medications   Medication Sig Start Date End Date Taking? Authorizing Provider  chlorhexidine (HIBICLENS) 4 % external liquid Apply topically daily as needed. Patient not taking: Reported on 12/03/2020 10/17/20   Particia NearingLane, Rachel Elizabeth, PA-C  lidocaine (XYLOCAINE) 2 % solution Dab with cotton swab to external areas of pain in vaginal area as needed Patient not taking: Reported on 12/03/2020 10/17/20   Particia NearingLane, Rachel Elizabeth, PA-C  metroNIDAZOLE (FLAGYL) 500 MG tablet Take 1 tablet (500 mg total) by mouth 2 (two) times daily. Patient not taking: Reported on 12/03/2020 10/19/20   Merrilee JanskyLamptey, Philip O, MD    Allergies    Patient has no known allergies.  Review of Systems   Review of Systems  Constitutional: Negative for chills and fever.  HENT: Positive for congestion and sore throat. Negative for trouble swallowing and voice change.   Respiratory: Positive for cough and shortness of breath.   Cardiovascular: Positive for chest pain. Negative for leg swelling.  Gastrointestinal: Positive for nausea and vomiting. Negative for abdominal pain and diarrhea.  All other systems reviewed and are negative.   Physical Exam Updated Vital Signs BP (!) 158/114   Pulse (!) 158   Temp 98.9 F (37.2 C) (Oral)   Resp (!) 32   Ht 5\' 1"  (1.549 m)   Wt 63.5 kg   SpO2 100%   BMI 26.45 kg/m   Physical Exam Vitals and nursing note reviewed.  Constitutional:      General: Diane Dickson is  not in acute distress.    Appearance: Diane Dickson is not ill-appearing.  HENT:     Head: Normocephalic.     Mouth/Throat:     Comments: Posterior oropharynx clear and mucous membranes moist, there is mild erythema but no edema or tonsillar exudates, uvula midline, normal phonation, no trismus, tolerating secretions without difficulty. Eyes:     Pupils: Pupils are equal, round, and reactive to light.  Neck:     Comments: No meningismus. Cardiovascular:     Rate and Rhythm: Normal rate and regular rhythm.      Pulses: Normal pulses.     Heart sounds: Normal heart sounds. No murmur heard. No friction rub. No gallop.   Pulmonary:     Breath sounds: Normal breath sounds.     Comments: Labored breathing.  No accessory muscle usage.  O2 saturation at 100% on room air.  Lungs clear to auscultation bilaterally. Abdominal:     General: Abdomen is flat. Bowel sounds are normal. There is no distension.     Palpations: Abdomen is soft.     Tenderness: There is no abdominal tenderness. There is no guarding or rebound.  Musculoskeletal:     Cervical back: Neck supple.     Comments: No lower extremity edema. Negative homan sign bilaterally.  Skin:    General: Skin is warm and dry.  Neurological:     General: No focal deficit present.     Mental Status: Diane Dickson is alert.  Psychiatric:        Mood and Affect: Mood normal.        Behavior: Behavior normal.     ED Results / Procedures / Treatments   Labs (all labs ordered are listed, but only abnormal results are displayed) Labs Reviewed  CBC WITH DIFFERENTIAL/PLATELET - Abnormal; Notable for the following components:      Result Value   WBC 12.2 (*)    RBC 5.12 (*)    Hemoglobin 17.4 (*)    HCT 53.7 (*)    MCV 104.9 (*)    Neutro Abs 11.5 (*)    Lymphs Abs 0.2 (*)    All other components within normal limits  BASIC METABOLIC PANEL - Abnormal; Notable for the following components:   Potassium 5.2 (*)    CO2 9 (*)    Glucose, Bld 212 (*)    Creatinine, Ser 1.24 (*)    Anion gap 27 (*)    All other components within normal limits  D-DIMER, QUANTITATIVE - Abnormal; Notable for the following components:   D-Dimer, Quant 0.98 (*)    All other components within normal limits  SARS CORONAVIRUS 2 (TAT 6-24 HRS)  ETHANOL  HCG, QUANTITATIVE, PREGNANCY  RAPID URINE DRUG SCREEN, HOSP PERFORMED  SALICYLATE LEVEL  ACETAMINOPHEN LEVEL  BLOOD GAS, VENOUS  AMMONIA  TROPONIN I (HIGH SENSITIVITY)    EKG None  Radiology DG Chest Portable 1  View  Result Date: 01/03/2021 CLINICAL DATA:  Complaining of sore throat, chills generalized body aches x1 day EXAM: PORTABLE CHEST 1 VIEW COMPARISON:  None. FINDINGS: The heart size and mediastinal contours are within normal limits. No focal consolidation. No pleural effusion. No pneumothorax. The visualized skeletal structures are unremarkable. Banded areas of punctate radiopaque densities over the superior thorax/neck related to ornamentation on patient's mask. IMPRESSION: No active disease. Electronically Signed   By: Maudry Mayhew MD   On: 01/03/2021 13:47    Procedures Procedures   Medications Ordered in ED Medications  LORazepam (ATIVAN) injection 0-4 mg (  1 mg Intravenous Given 01/03/21 1315)    Or  LORazepam (ATIVAN) tablet 0-4 mg ( Oral See Alternative 01/03/21 1315)  LORazepam (ATIVAN) injection 0-4 mg (has no administration in time range)    Or  LORazepam (ATIVAN) tablet 0-4 mg (has no administration in time range)  thiamine tablet 100 mg (100 mg Oral Given 01/03/21 1316)    Or  thiamine (B-1) injection 100 mg ( Intravenous See Alternative 01/03/21 1316)  sodium chloride 0.9 % bolus 1,000 mL (has no administration in time range)  iohexol (OMNIPAQUE) 350 MG/ML injection 100 mL (has no administration in time range)  sodium chloride 0.9 % 1,000 mL with thiamine 100 mg, folic acid 1 mg, multivitamins adult 10 mL infusion (has no administration in time range)  LORazepam (ATIVAN) injection 1 mg (has no administration in time range)  ondansetron (ZOFRAN-ODT) disintegrating tablet 4 mg (4 mg Oral Given 01/03/21 1316)  LORazepam (ATIVAN) injection 1 mg (1 mg Intravenous Given 01/03/21 1330)    ED Course  I have reviewed the triage vital signs and the nursing notes.  Pertinent labs & imaging results that were available during my care of the patient were reviewed by me and considered in my medical decision making (see chart for details).  Clinical Course as of 01/03/21 1517  Mon Jan 03, 2021  1439 Anion gap(!): 27 [CA]  1439 Potassium(!): 5.2 [CA]  1439 CO2(!): 9 [CA]    Clinical Course User Index [CA] Mannie Stabile, PA-C   MDM Rules/Calculators/A&P                         25 year old female presents to the ED due to nausea, vomiting, cough, shortness of breath, chest pain, and sore throat x1 day.  Patient is unvaccinated for COVID-19.  Denies sick contacts known Covid exposures.  Upon arrival, patient afebrile, not tachycardic or hypoxic.  Patient in no acute distress and nontoxic-appearing.  Physical exam reassuring.  Lungs clear to auscultation bilaterally; however, patient does have labored breathing. No accessory muscle usage. Abdomen soft, nondistended, nontender.  No lower extremity edema.  Given patient states Diane Dickson has had over 10 episodes of nonbloody, nonbilious emesis will obtain routine labs to rule out electrolyte abnormalities.  Chest x-ray and EKG given shortness of breath and chest pain.  Low suspicion for ACS given chest pain is mostly while coughing and appears atypical in nature. Low suspicion for bacterial infection.  No meningismus to suggest meningitis.  12:59 PM Patient placed on cardiac monitoring and found to be tachycardic at 144. D-dimer and UDS added. Troponin added as well.   1:08 PM upon reassessment, patient remains tachycardic.  Patient notes Diane Dickson drinks either half a gallon or a gallon of liquor daily.  Last alcoholic beverage was Saturday. Denies drug use except occasional marijuana.  Suspect tachycardia related to alcohol withdrawal.  CIWA protocol placed.  Ativan given.  Denies any hallucinations.  No history of complicated withdrawal.  No seizures. Patient denies ingestion of any drugs prior to arrival.   2:02 PM reassessed patient at bedside.  Patient's heart rate remains in the 140s.  Patient given second mg of Ativan.   CBC significant for mild leukocytosis of 12.2 with elevated hemoglobin at 17.4 possibly due to hemoconcentration.   D-dimer elevated 0.98.  CTA chest ordered to rule out PE given chest pain and shortness of breath.  Ethanol level within normal limits.  Chest x-ray personally reviewed which is negative for signs of  pneumonia, pneumothorax or widened mediastinum.  EKG personally reviewed which demonstrates sinus tachycardia.  2:45 PM reassessed patient at bedside.  Heart rate still in the 140s.  Patient notes mild improvement in symptoms.  Patient alert and oriented x4. BMP significant for hyperkalemia 5.2, CO2 9, hyperglycemia 212, creatinine at 1.24 and anion gap of 27. Troponin normal at 4.  Pregnancy test negative. VBG, ammonia, salicylate/acetaminophen level added. Patient given more ativan and banana bag. Discussed case with Dr. Donnald Garre who evaluated patient at bedside and agrees with assessment and plan. Patient has decompensated throughout her ED stay, may possibly be in DTs. Patient appears confused at bedside which is different than initial evaluation.  Patient handed off to Arthor Captain, PA-C at shift change. Patient will need admission for alcohol withdrawal with possible DTs. COVID test pending.   Final Clinical Impression(s) / ED Diagnoses Final diagnoses:  Alcohol withdrawal syndrome with complication [facility])    Rx / DC Orders ED Discharge Orders    None       Jesusita Oka 01/03/21 1524    Arby Barrette, MD 01/04/21 [phone]

## 2021-01-03 NOTE — Progress Notes (Signed)
A consult was received from an ED provider for Zosyn and vancomycin per pharmacy dosing.  The patient's profile has been reviewed for ht/wt/allergies/indication/available labs.   A one time order has been placed for Zosyn 3.375 g IV and vancomycin 1500 mg IV.  Further antibiotics/pharmacy consults should be ordered by admitting physician if indicated.                       Thank you, Royce Macadamia, PharmD, BCPS 01/03/2021  4:37 PM

## 2021-01-03 NOTE — Progress Notes (Signed)
Was called for admission of the patient.  Requested PCCM consult from EDP due to concern for extensive pneumomediastinum and severe metabolic acidosis, PH 7.1 on VBG and serum bicarb 9.  Went in the room to see the patient.  A family member was present.  Prior to starting my assessment, received a page from EDP informing me that PCCM will admit the patient.  Appreciate Dr. George Hugh assistance.  Patient will likely transfer to [facility] service once she is more stable.

## 2021-01-03 NOTE — Consult Note (Addendum)
This note is the H&P for this patient on 01/03/21    NAME:  Diane Dickson, MRN:  [id number], DOB:  [birth year], LOS: 0 ADMISSION DATE:  01/03/2021, CONSULTATION DATE:  01/03/21 REFERRING MD: Arthor Captain, PA CHIEF COMPLAINT:  Pneumomediastinum  Brief History:  25 year old female with alcohol abuse who presents with alcohol withdrawal.  PCCM consulted for pneumomediastinum.  History of Present Illness:  [name] is a 25 year old female with alcohol abuse and prior history of suicidal ideation who presents generalized symptoms of nausea vomiting body aches and shortness of breath. She reports having more than 10 episodes of nonbloody emesis yesterday. She usually drinks 1/2 to 1 gallon liquor daily and decided to stop drinking 3 days ago. Often times with drinking heavily, she will vomit which is baseline for her.  In the ED she is tachycardic and hypertensive. Mild tachypnea with normal O2 saturations on room air. Labs reviewed. WBC 12.2, Hgb 17.4, likely hemo-concentrated. Electrolytes concerning for mild AKI with K5.5 CO2 9 BUN/creatinine 12/1.24. Ethanol level, Tylenol, salicylate nondetectable. D-dimer was noted to be elevated so CTA was ordered which demonstrated pneumomediastinum.  She has received total of 5 mg of Ativan in the last 4 hours.  Started on IV fluids, thiamine and antiemetics.  She intermittently follows commands.  Mother at bedside.  Updated on plan  BMET    Component Value Date/Time   NA 135 01/03/2021 1307   K 5.2 (H) 01/03/2021 1307   CL 99 01/03/2021 1307   CO2 9 (L) 01/03/2021 1307   GLUCOSE 212 (H) 01/03/2021 1307   BUN 12 01/03/2021 1307   CREATININE 1.24 (H) 01/03/2021 1307   CALCIUM 10.3 01/03/2021 1307   GFRNONAA >60 01/03/2021 1307    Past Medical History:  Alcohol abuse prior suicidal ideation  Significant Hospital Events:    Consults:  PCCM  Procedures:  None  Significant Diagnostic Tests:  CTA 01/03/2021-extensive pneumomediastinum  anterior middle and posterior regions.  Air dissecting to the pericardium without any compression to the heart.  Subcutaneous air also present.  No pulmonary embolism  Micro Data:    Antimicrobials:     Interim History / Subjective:  As above  Objective   Blood pressure (!) 155/109, pulse (!) 137, temperature 98.9 F (37.2 C), temperature source Oral, resp. rate (!) 34, height 5\' 1"  (1.549 m), weight 63.5 kg, SpO2 100 %.       No intake or output data in the 24 hours ending 01/03/21 1637 Filed Weights   01/03/21 1153  Weight: 63.5 kg    Physical Exam: General: Well-appearing, no acute distress HENT: [place], AT Eyes: EOMI, no scleral icterus Respiratory: Crepitus in upper chest. Clear to auscultation bilaterally.  No crackles, wheezing or rales Cardiovascular: Tachycardic, regular rhythm, -M/R/G, no JVD GI: BS+, soft, nontender Extremities:-Edema,-tenderness Neuro: Awake and alert, not oriented to self. Intermittently follows commands. Skin: Intact, no rashes or bruising Psych: Abnormal affect  Resolved Hospital Problem list     Assessment & Plan:   Pneumomediastinum Extensive pneumomediastinum noted on CTA.  In the setting of recent nausea and vomiting and concern for possible esophageal rupture as the etiology for these findings.  Discussed imaging with radiology with no apparent esophageal abnormalities however recommend oral contrast to evaluate. --Remain NPO --CT chest with oral contrast to rule out esophageal rupture --At risk for aspiration, admit to the ICU  Metabolic acidosis secondary to bicarbonate loss in setting of nausea and vomiting --Start sodium bicarb drip --Trend BMP --As needed Zofran  Alcohol withdrawal --CIWA protocol with IV Ativan --IV thiamine, folic acid and multivitamin daily  Best practice (evaluated daily)  Diet: NPO Pain/Anxiety/Delirium protocol (if indicated): -- VAP protocol (if indicated): -- DVT prophylaxis: Heparin subq GI  prophylaxis: PPI Glucose control: CBG q4h Mobility: As tolerated  Goals of Care:  Last date of multidisciplinary goals of care discussion: Family and staff present:  Summary of discussion:  Follow up goals of care discussion due: Code Status: Full code  Labs   CBC: Recent Labs  Lab 01/03/21 1307  WBC 12.2*  NEUTROABS 11.5*  HGB 17.4*  HCT 53.7*  MCV 104.9*  PLT 209    Basic Metabolic Panel: Recent Labs  Lab 01/03/21 1307  NA 135  K 5.2*  CL 99  CO2 9*  GLUCOSE 212*  BUN 12  CREATININE 1.24*  CALCIUM 10.3   GFR: Estimated Creatinine Clearance: 60.8 mL/min (A) (by C-G formula based on SCr of 1.24 mg/dL (H)). Recent Labs  Lab 01/03/21 1307  WBC 12.2*    Liver Function Tests: No results for input(s): AST, ALT, ALKPHOS, BILITOT, PROT, ALBUMIN in the last 168 hours. No results for input(s): LIPASE, AMYLASE in the last 168 hours. No results for input(s): AMMONIA in the last 168 hours.  ABG No results found for: PHART, PCO2ART, PO2ART, HCO3, TCO2, ACIDBASEDEF, O2SAT   Coagulation Profile: No results for input(s): INR, PROTIME in the last 168 hours.  Cardiac Enzymes: No results for input(s): CKTOTAL, CKMB, CKMBINDEX, TROPONINI in the last 168 hours.  HbA1C: No results found for: HGBA1C  CBG: No results for input(s): GLUCAP in the last 168 hours.  Review of Systems:   Unable to obtain due to altered mental status  Past Medical History:  She,  has no past medical history on file.   Surgical History:  History reviewed. No pertinent surgical history.   Social History:   reports that she has never smoked. She has never used smokeless tobacco. She reports current alcohol use. She reports that she does not use drugs.   Family History:  Her family history includes Healthy in her mother.   Allergies No Known Allergies   Home Medications  Prior to Admission medications   Medication Sig Start Date End Date Taking? Authorizing Provider  chlorhexidine  (HIBICLENS) 4 % external liquid Apply topically daily as needed. Patient not taking: Reported on 12/03/2020 10/17/20   Particia Nearing, PA-C  lidocaine (XYLOCAINE) 2 % solution Dab with cotton swab to external areas of pain in vaginal area as needed Patient not taking: Reported on 12/03/2020 10/17/20   Particia Nearing, PA-C  metroNIDAZOLE (FLAGYL) 500 MG tablet Take 1 tablet (500 mg total) by mouth 2 (two) times daily. Patient not taking: Reported on 12/03/2020 10/19/20   Merrilee Jansky, MD     Care time: 35 minutes    The patient is critically ill with multiple organ systems failure and requires high complexity decision making for assessment and support, frequent evaluation and titration of therapies, application of advanced monitoring technologies and extensive interpretation of multiple databases.  Mechele Collin, M.D. [facility] Pulmonary/Critical Care Medicine 01/03/2021 4:37 PM   Please see Amion for pager number to reach on-call Pulmonary and Critical Care Team.

## 2021-01-03 NOTE — ED Notes (Signed)
Verbally updated Dr. Margo Aye regarding critical values bedside

## 2021-01-04 ENCOUNTER — Inpatient Hospital Stay (HOSPITAL_COMMUNITY): Payer: Self-pay

## 2021-01-04 LAB — CBC
HCT: 36.1 % (ref 36.0–46.0)
Hemoglobin: 12.2 g/dL (ref 12.0–15.0)
MCH: 34.5 pg — ABNORMAL HIGH (ref 26.0–34.0)
MCHC: 33.8 g/dL (ref 30.0–36.0)
MCV: 102 fL — ABNORMAL HIGH (ref 80.0–100.0)
Platelets: 144 10*3/uL — ABNORMAL LOW (ref 150–400)
RBC: 3.54 MIL/uL — ABNORMAL LOW (ref 3.87–5.11)
RDW: 13.2 % (ref 11.5–15.5)
WBC: 5.5 10*3/uL (ref 4.0–10.5)
nRBC: 0 % (ref 0.0–0.2)

## 2021-01-04 LAB — BASIC METABOLIC PANEL
Anion gap: 13 (ref 5–15)
Anion gap: 13 (ref 5–15)
BUN: 8 mg/dL (ref 6–20)
BUN: 7 mg/dL (ref 6–20)
CO2: 17 mmol/L — ABNORMAL LOW (ref 22–32)
CO2: 25 mmol/L (ref 22–32)
Calcium: 8.7 mg/dL — ABNORMAL LOW (ref 8.9–10.3)
Calcium: 9.1 mg/dL (ref 8.9–10.3)
Chloride: 107 mmol/L (ref 98–111)
Chloride: 103 mmol/L (ref 98–111)
Creatinine, Ser: 0.54 mg/dL (ref 0.44–1.00)
Creatinine, Ser: 0.49 mg/dL (ref 0.44–1.00)
GFR, Estimated: >60 mL/min (ref >60)
GFR, Estimated: >60 mL/min (ref >60)
Glucose, Bld: 83 mg/dL (ref 70–99)
Glucose, Bld: 57 mg/dL — ABNORMAL LOW (ref 70–99)
Potassium: 3.3 mmol/L — ABNORMAL LOW (ref 3.5–5.1)
Potassium: 4.2 mmol/L (ref 3.5–5.1)
Sodium: 137 mmol/L (ref 135–145)
Sodium: 141 mmol/L (ref 135–145)

## 2021-01-04 LAB — MAGNESIUM: Magnesium: 1.8 mg/dL (ref 1.7–2.4)

## 2021-01-04 LAB — GLUCOSE, CAPILLARY
Glucose-Capillary: 123 mg/dL — ABNORMAL HIGH (ref 70–99)
Glucose-Capillary: 52 mg/dL — ABNORMAL LOW (ref 70–99)
Glucose-Capillary: 66 mg/dL — ABNORMAL LOW (ref 70–99)
Glucose-Capillary: 67 mg/dL — ABNORMAL LOW (ref 70–99)
Glucose-Capillary: 70 mg/dL (ref 70–99)
Glucose-Capillary: 76 mg/dL (ref 70–99)
Glucose-Capillary: 87 mg/dL (ref 70–99)
Glucose-Capillary: 95 mg/dL (ref 70–99)

## 2021-01-04 LAB — PHOSPHORUS: Phosphorus: 2.3 mg/dL — ABNORMAL LOW (ref 2.5–4.6)

## 2021-01-04 MED ORDER — DEXTROSE 50 % IV SOLN
12.5000 g | INTRAVENOUS | Status: AC
  Administered 2021-01-04: 12.5 g via INTRAVENOUS

## 2021-01-04 MED ORDER — POTASSIUM CHLORIDE 10 MEQ/100ML IV SOLN
10.0000 meq | INTRAVENOUS | Status: AC
Start: 2021-01-04 — End: 2021-01-04
  Administered 2021-01-04 (×4): 10 meq via INTRAVENOUS
  Filled 2021-01-04 (×4): qty 100

## 2021-01-04 MED ORDER — MAGNESIUM SULFATE 2 GM/50ML IV SOLN
2.0000 g | Freq: Once | INTRAVENOUS | Status: AC
  Administered 2021-01-04: 2 g via INTRAVENOUS
  Filled 2021-01-04: qty 50

## 2021-01-04 MED ORDER — DEXTROSE 50 % IV SOLN
INTRAVENOUS | Status: AC
  Filled 2021-01-04: qty 50

## 2021-01-04 MED ORDER — DEXTROSE-NACL 5-0.45 % IV SOLN: INTRAVENOUS | Status: DC

## 2021-01-04 MED ORDER — DEXTROSE 50 % IV SOLN
INTRAVENOUS | Status: AC
  Administered 2021-01-04: 50 mL
  Filled 2021-01-04: qty 50

## 2021-01-04 MED ORDER — POTASSIUM PHOSPHATES 15 MMOLE/5ML IV SOLN
15.0000 mmol | Freq: Once | INTRAVENOUS | Status: AC
  Administered 2021-01-04: 15 mmol via INTRAVENOUS
  Filled 2021-01-04: qty 5

## 2021-01-04 MED ORDER — IOHEXOL 300 MG/ML  SOLN
150.0000 mL | Freq: Once | INTRAMUSCULAR | Status: AC | PRN
  Administered 2021-01-04: 1 mL via ORAL

## 2021-01-04 NOTE — Progress Notes (Signed)
Pt has a cbg of 67 @ 1200. IV Dextrose 5% was given. CBG rechecked 15 mins later. CBG 123. Pt had a CBG of 66 @ 1600. IV Dextrose 5% given. CBG recked 15 mins later. CBG 87. This nurse will continue to monitor.

## 2021-01-04 NOTE — Progress Notes (Signed)
K+ 3.3, phos 2.3, Mg 1.8 Replaced per protocol

## 2021-01-04 NOTE — Progress Notes (Signed)
Initial Nutrition Assessment  DOCUMENTATION CODES:   Not applicable  INTERVENTION:  - diet advancement as medically feasible.   NUTRITION DIAGNOSIS:   Inadequate oral intake related to inability to eat as evidenced by NPO status.  GOAL:   Patient will meet greater than or equal to 90% of their needs  MONITOR:   Diet advancement,Labs,Weight trends  REASON FOR ASSESSMENT:   Malnutrition Screening Tool  ASSESSMENT:   25 year old female with history of alcohol abuse and prior history of suicidal ideation. She presented to the ED due to N/V, body aches, and shortness of breath. She reported 10 episodes of non-bloody emesis the day PTA. She drinks -1 gallon liquor/day and stopped drinking 3 days PTA.  Patient noted to be a/o to self only. She has been NPO since admission. Patient discussed in rounds this AM; plan for esophagram this afternoon.  She has not been seen by a [place] RD at any time in the past.  Weight yesterday and today is 121 lb and PTA the most recently documented weight was on 12/03/20 when she weighed 130 lb. This indicates 9 lb weight loss (7% body weight) in the past 1 month; significant for time frame.  Per notes: - pneumomediastinum  - metabolic acidosis 2/2 bicarb loss 2/2 N/V - alcohol withdrawal   Labs reviewed; CBG: 76 mg/dl, K: 3.3 mmol/l, Ca: 8.7 mg/dl, Phos: 2.3 mg/dl. Medications reviewed; 1 mg folvite/day, 2 g IV Mg sulfate x2 run 2/22, 1 tablet multivitamin with minerals/day, 10 mEq IV KCl x4 runs 2/22, 15 mmol IV KPhos x1 run 2/22, 100 mg thiamine/day. IVF; D5-150 mEq sodium bicarb @ 75 ml/hr (306 kcal/24 hours).     NUTRITION - FOCUSED PHYSICAL EXAM:  unable to complete at this time.   Diet Order:   Diet Order            Diet NPO time specified  Diet effective now                 EDUCATION NEEDS:   No education needs have been identified at this time  Skin:  Skin Assessment: Reviewed RN Assessment  Last BM:   PTA/unknown  Height:   Ht Readings from Last 1 Encounters:  01/03/21 5\' 1"  (1.549 m)    Weight:   Wt Readings from Last 1 Encounters:  01/04/21 54.9 kg    Estimated Nutritional Needs:  Kcal:  1920-2190 kcal Protein:  105-120 grams Fluid:  >/= 2.5 L/day      01/06/21, MS, RD, LDN, CNSC Inpatient Clinical Dietitian RD pager # available in AMION  After hours/weekend pager # available in [facility]

## 2021-01-04 NOTE — Progress Notes (Signed)
eLink Physician-Brief Progress Note Patient Name: Diane Dickson DOB: Jul 20, 1996 MRN: [id number]   Date of Service  01/04/2021  HPI/Events of Note  Notified of hypoglycemia and pt is NPO.   eICU Interventions  Start on D5 1/2 NS.     Intervention Category Minor Interventions: Other:  Larinda Buttery 01/04/2021, 8:20 PM

## 2021-01-04 NOTE — Progress Notes (Signed)
This note is the H&P for this patient on 01/03/21    NAME:  Diane Dickson, MRN:  [id number], DOB:  [birth year], LOS: 1 ADMISSION DATE:  01/03/2021, CONSULTATION DATE:  01/03/21 REFERRING MD: Arthor Captain, PA CHIEF COMPLAINT:  Pneumomediastinum  Brief History:  25 year old female with alcohol abuse who presents with alcohol withdrawal.  PCCM consulted for pneumomediastinum.  History of Present Illness:  [name] is a 25 year old female with alcohol abuse and prior history of suicidal ideation who presents generalized symptoms of nausea vomiting body aches and shortness of breath. She reports having more than 10 episodes of nonbloody emesis yesterday. She usually drinks 1/2 to 1 gallon liquor daily and decided to stop drinking 3 days ago. Often times with drinking heavily, she will vomit which is baseline for her.  In the ED she is tachycardic and hypertensive. Mild tachypnea with normal O2 saturations on room air. Labs reviewed. WBC 12.2, Hgb 17.4, likely hemo-concentrated. Electrolytes concerning for mild AKI with K5.5 CO2 9 BUN/creatinine 12/1.24. Ethanol level, Tylenol, salicylate nondetectable. D-dimer was noted to be elevated so CTA was ordered which demonstrated pneumomediastinum.  She has received total of 5 mg of Ativan in the last 4 hours.  Started on IV fluids, thiamine and antiemetics.  She intermittently follows commands.  Mother at bedside.  Updated on plan   Past Medical History:  Alcohol abuse prior suicidal ideation  Significant Hospital Events:    Consults:  PCCM  Procedures:  None  Significant Diagnostic Tests:  CTA 01/03/2021-extensive pneumomediastinum anterior middle and posterior regions.  Air dissecting to the pericardium without any compression to the heart.  Subcutaneous air also present.  No pulmonary embolism  CT Chest with oral contrast 01/03/21 - Per tech, unable to time oral contrast due to patient unable to swallow contrast on command. Unchanged  large pneumomediastinum and pneumopericardium with extension to neck. No mediastinal fluid collected.  Micro Data:    Antimicrobials:     Interim History / Subjective:  Responds to short questions. Oriented to self. Drowsy. Started on precedex overnight. No emesis or nausea  Objective   Blood pressure (!) 154/121, pulse (!) 104, temperature 98.4 F (36.9 C), temperature source Axillary, resp. rate 19, height 5\' 1"  (1.549 m), weight 54.9 kg, SpO2 95 %.        Intake/Output Summary (Last 24 hours) at 01/04/2021 0923 Last data filed at 01/04/2021 0827 Gross per 24 hour  Intake 990.41 ml  Output 700 ml  Net 290.41 ml   Filed Weights   01/03/21 1153 01/03/21 2030 01/04/21 0500  Weight: 63.5 kg 54.9 kg 54.9 kg   Physical Exam: General: Drowsy, well-appearing, no acute distress HENT: [facility], AT, OP clear, MMM Eyes: EOMI, no scleral icterus Respiratory: Crepitus in upper chest. Clear to auscultation bilaterally.  No crackles, wheezing or rales Cardiovascular: RRR, -M/R/G, no JVD GI: BS+, soft, nontender Extremities:-Edema,-tenderness Neuro: Drowsy, awakens to voice, follows commands intermittently, CNII-XII grossly intact  Imaging, labs and test noted above have been reviewed independently by me as noted above.  Resolved Hospital Problem list     Assessment & Plan:   Pneumomediastinum Extensive pneumomediastinum noted on CTA.  In the setting of recent nausea and vomiting, concern for possible esophageal rupture as the etiology for these findings.  Discussed imaging with radiology with no apparent esophageal abnormalities however recommend oral contrast to evaluate. Unfortunately unable to time out oral contrast during second CT. No evidence of mediastinal fluid. Will pursue esophagram to confirm normal esophagus in  patient prior to starting PO meds and diet --Remain NPO --Esophagram --At risk for aspiration  Metabolic acidosis secondary to bicarbonate loss in setting of nausea  and vomiting - improving --Continue sodium bicarb drip --Trend BMP --As needed Zofran  Alcohol withdrawal --Precedex --IV thiamine, folic acid and multivitamin daily  Best practice (evaluated daily)  Diet: NPO Pain/Anxiety/Delirium protocol (if indicated): -- VAP protocol (if indicated): -- DVT prophylaxis: Heparin subq GI prophylaxis: PPI Glucose control: CBG q4h Mobility: As tolerated  Goals of Care:  Last date of multidisciplinary goals of care discussion: Family and staff present:  Summary of discussion:  Follow up goals of care discussion due: Code Status: Full code  Labs   CBC: Recent Labs  Lab 01/03/21 1307 01/04/21 0507  WBC 12.2* 5.5  NEUTROABS 11.5*  --   HGB 17.4* 12.2  HCT 53.7* 36.1  MCV 104.9* 102.0*  PLT 209 144*    Basic Metabolic Panel: Recent Labs  Lab 01/03/21 1307 01/04/21 0507  NA 135 137  K 5.2* 3.3*  CL 99 107  CO2 9* 17*  GLUCOSE 212* 83  BUN 12 8  CREATININE 1.24* 0.54  CALCIUM 10.3 8.7*  MG  --  1.8  PHOS  --  2.3*   GFR: Estimated Creatinine Clearance: 83.2 mL/min (by C-G formula based on SCr of 0.54 mg/dL). Recent Labs  Lab 01/03/21 1307 01/03/21 1605 01/04/21 0507  WBC 12.2*  --  5.5  LATICACIDVEN  --  2.1*  --     Liver Function Tests: Recent Labs  Lab 01/03/21 1307  AST 75*  ALT 61*  ALKPHOS 62  BILITOT 1.6*  PROT 10.9*  ALBUMIN 5.5*   No results for input(s): LIPASE, AMYLASE in the last 168 hours. Recent Labs  Lab 01/03/21 1605  AMMONIA 71*    ABG    Component Value Date/Time   PHART 7.267 (L) 01/03/2021 1759   PCO2ART 21.3 (L) 01/03/2021 1759   PO2ART 110 (H) 01/03/2021 1759   HCO3 9.4 (L) 01/03/2021 1759   ACIDBASEDEF 15.9 (H) 01/03/2021 1759   O2SAT 97.3 01/03/2021 1759     Coagulation Profile: Recent Labs  Lab 01/03/21 1304  INR 1.2    Cardiac Enzymes: No results for input(s): CKTOTAL, CKMB, CKMBINDEX, TROPONINI in the last 168 hours.  HbA1C: No results found for:  HGBA1C  CBG: Recent Labs  Lab 01/04/21 0842  GLUCAP 76    Care time: 35 minutes    Diane Dickson, M.D. [facility] Pulmonary/Critical Care Medicine 01/04/2021 9:23 AM

## 2021-01-04 NOTE — TOC Initial Note (Signed)
Transition of Care [facility]) - Initial/Assessment Note    Patient Details  Name: Diane Dickson MRN: [id number] Date of Birth: 01-Aug-1996  Transition of Care [facility]) CM/SW Contact:    Golda Acre, RN Phone Number: 01/04/2021, 8:07 AM  Clinical Narrative:                 25 year old female with alcohol abuse and prior history of suicidal ideation who presents generalized symptoms of nausea vomiting body aches and shortness of breath. She reports having more than 10 episodes of nonbloody emesis yesterday. She usually drinks 1/2 to 1 gallon liquor daily and decided to stop drinking 3 days ago. Often times with drinking heavily, she will vomit which is baseline for her.  In the ED she is tachycardic and hypertensive. Mild tachypnea with normal O2 saturations on room air. Labs reviewed. WBC 12.2, Hgb 17.4, likely hemo-concentrated. Electrolytes concerning for mild AKI with K5.5 CO2 9 BUN/creatinine 12/1.24. Ethanol level, Tylenol, salicylate nondetectable. D-dimer was noted to be elevated so CTA was ordered which demonstrated pneumomediastinum.  She has received total of 5 mg of Ativan in the last 4 hours.  Started on IV fluids, thiamine and antiemetics.  She intermittently follows commands.  Mother at bedside.  Updated on plan [id number] started on Precedex drip due to agitation,  Plan will follow for toc needs  Expected Discharge Plan: Home/Self Care Barriers to Discharge: Continued Medical Work up   Patient Goals and CMS Choice Patient states their goals for this hospitalization and ongoing recovery are:: UNABLE TO STATE      Expected Discharge Plan and Services Expected Discharge Plan: Home/Self Care   Discharge Planning Services: CM Consult   Living arrangements for the past 2 months: Apartment                                      Prior Living Arrangements/Services Living arrangements for the past 2 months: Apartment Lives with:: Self Patient language and  need for interpreter reviewed:: Yes              Criminal Activity/Legal Involvement Pertinent to Current Situation/Hospitalization: No - Comment as needed  Activities of Daily Living Home Assistive Devices/Equipment: Other (Comment) (pt unable to verbalize what equipment she uses at present time) ADL Screening (condition at time of admission) Patient's cognitive ability adequate to safely complete daily activities?: No Is the patient deaf or have difficulty hearing?: No Does the patient have difficulty seeing, even when wearing glasses/contacts?: No Does the patient have difficulty concentrating, remembering, or making decisions?: Yes Patient able to express need for assistance with ADLs?: No Does the patient have difficulty dressing or bathing?: Yes Independently performs ADLs?: No Communication: Independent Dressing (OT): Needs assistance Is this a change from baseline?: Change from baseline, expected to last >3 days Grooming: Needs assistance Is this a change from baseline?: Change from baseline, expected to last >3 days Feeding: Needs assistance Is this a change from baseline?: Change from baseline, expected to last >3 days Bathing: Needs assistance Is this a change from baseline?: Change from baseline, expected to last >3 days Toileting: Needs assistance Is this a change from baseline?: Change from baseline, expected to last >3days In/Out Bed: Needs assistance Is this a change from baseline?: Change from baseline, expected to last >3 days Walks in Home: Needs assistance Is this a change from baseline?: Change from baseline, expected to last >3 days Does  the patient have difficulty walking or climbing stairs?: Yes Weakness of Legs: Both Weakness of Arms/Hands: Both  Permission Sought/Granted                  Emotional Assessment Appearance:: Appears stated age Attitude/Demeanor/Rapport: Unable to Assess Affect (typically observed): Unable to Assess Orientation: :  Fluctuating Orientation (Suspected and/or reported Sundowners) Alcohol / Substance Use: Alcohol Use,Illicit Drugs Psych Involvement: No (comment)  Admission diagnosis:  Pneumomediastinum (HCC) [J98.2] Metabolic acidosis [E87.2] Alcohol withdrawal syndrome with complication [facility]) [F10.239] Patient Active Problem List   Diagnosis Date Noted  . Pneumomediastinum (HCC) 01/03/2021   PCP:  Pcp, No Pharmacy:   [facility] [address], [place] - [address] [place] [address] Phone: [phone] Fax: [phone]     Social Determinants of Health (SDOH) Interventions    Readmission Risk Interventions No flowsheet data found.

## 2021-01-04 NOTE — H&P (Signed)
Please see consult note written by me on 01/03/21. This was mislabeled and is the H&P.  Mechele Collin, M.D. [facility] Pulmonary/Critical Care Medicine 01/04/2021 12:37 PM

## 2021-01-05 ENCOUNTER — Inpatient Hospital Stay (HOSPITAL_COMMUNITY): Payer: Self-pay

## 2021-01-05 LAB — COMPREHENSIVE METABOLIC PANEL
ALT: 33 U/L (ref 0–44)
AST: 49 U/L — ABNORMAL HIGH (ref 15–41)
Albumin: 3.5 g/dL (ref 3.5–5.0)
Alkaline Phosphatase: 44 U/L (ref 38–126)
Anion gap: 13 (ref 5–15)
BUN: <5 mg/dL — ABNORMAL LOW (ref 6–20)
CO2: 29 mmol/L (ref 22–32)
Calcium: 8.8 mg/dL — ABNORMAL LOW (ref 8.9–10.3)
Chloride: 95 mmol/L — ABNORMAL LOW (ref 98–111)
Creatinine, Ser: 0.41 mg/dL — ABNORMAL LOW (ref 0.44–1.00)
GFR, Estimated: >60 mL/min (ref >60)
Glucose, Bld: 69 mg/dL — ABNORMAL LOW (ref 70–99)
Potassium: 3.2 mmol/L — ABNORMAL LOW (ref 3.5–5.1)
Sodium: 137 mmol/L (ref 135–145)
Total Bilirubin: 1.4 mg/dL — ABNORMAL HIGH (ref 0.3–1.2)
Total Protein: 7 g/dL (ref 6.5–8.1)

## 2021-01-05 LAB — GLUCOSE, CAPILLARY
Glucose-Capillary: 101 mg/dL — ABNORMAL HIGH (ref 70–99)
Glucose-Capillary: 76 mg/dL (ref 70–99)
Glucose-Capillary: 88 mg/dL (ref 70–99)
Glucose-Capillary: 79 mg/dL (ref 70–99)
Glucose-Capillary: 84 mg/dL (ref 70–99)

## 2021-01-05 LAB — URINE CULTURE

## 2021-01-05 LAB — PHOSPHORUS: Phosphorus: 2.6 mg/dL (ref 2.5–4.6)

## 2021-01-05 MED ORDER — POTASSIUM CHLORIDE 10 MEQ/100ML IV SOLN
10.0000 meq | INTRAVENOUS | Status: DC
  Administered 2021-01-05 (×3): 10 meq via INTRAVENOUS
  Filled 2021-01-05 (×3): qty 100

## 2021-01-05 MED ORDER — IOHEXOL 300 MG/ML  SOLN
150.0000 mL | Freq: Once | INTRAMUSCULAR | Status: AC | PRN
  Administered 2021-01-05 (×2): 150 mL via ORAL

## 2021-01-05 MED ORDER — POTASSIUM CHLORIDE CRYS ER 20 MEQ PO TBCR
40.0000 meq | EXTENDED_RELEASE_TABLET | Freq: Once | ORAL | Status: AC
  Administered 2021-01-05: 40 meq via ORAL
  Filled 2021-01-05: qty 2

## 2021-01-05 NOTE — Plan of Care (Signed)
  Problem: Education: Goal: Knowledge of disease or condition will improve Outcome: Progressing Goal: Understanding of discharge needs will improve Outcome: Progressing   Problem: Health Behavior/Discharge Planning: Goal: Ability to identify changes in lifestyle to reduce recurrence of condition will improve Outcome: Progressing Goal: Identification of resources available to assist in meeting health care needs will improve Outcome: Progressing   Problem: Physical Regulation: Goal: Complications related to the disease process, condition or treatment will be avoided or minimized Outcome: Progressing   Problem: Safety: Goal: Ability to remain free from injury will improve Outcome: Progressing   Problem: Education: Goal: Knowledge of risk factors and measures for prevention of condition will improve Outcome: Progressing   Problem: Coping: Goal: Psychosocial and spiritual needs will be supported Outcome: Progressing   Problem: Respiratory: Goal: Will maintain a patent airway Outcome: Progressing Goal: Complications related to the disease process, condition or treatment will be avoided or minimized Outcome: Progressing

## 2021-01-05 NOTE — Progress Notes (Signed)
This note is the H&P for this patient on 01/03/21    NAME:  Diane Dickson, MRN:  [id number], DOB:  1996-10-30, LOS: 2 ADMISSION DATE:  01/03/2021, CONSULTATION DATE:  01/03/21 REFERRING MD: Arthor Captain, PA CHIEF COMPLAINT:  Pneumomediastinum  Brief History:  25 year old female with alcohol abuse who presents with alcohol withdrawal.  PCCM consulted for pneumomediastinum.  History of Present Illness:  [name] is a 25 year old female with alcohol abuse and prior history of suicidal ideation who presents generalized symptoms of nausea vomiting body aches and shortness of breath. She reports having more than 10 episodes of nonbloody emesis yesterday. She usually drinks 1/2 to 1 gallon liquor daily and decided to stop drinking 3 days ago. Often times with drinking heavily, she will vomit which is baseline for her.  In the ED she is tachycardic and hypertensive. Mild tachypnea with normal O2 saturations on room air. Labs reviewed. WBC 12.2, Hgb 17.4, likely hemo-concentrated. Electrolytes concerning for mild AKI with K5.5 CO2 9 BUN/creatinine 12/1.24. Ethanol level, Tylenol, salicylate nondetectable. D-dimer was noted to be elevated so CTA was ordered which demonstrated pneumomediastinum.  She has received total of 5 mg of Ativan in the last 4 hours.  Started on IV fluids, thiamine and antiemetics.  She intermittently follows commands.  Mother at bedside.  Updated on plan   Past Medical History:  Alcohol abuse prior suicidal ideation  Significant Hospital Events:    Consults:  PCCM  Procedures:  None  Significant Diagnostic Tests:  CTA 01/03/2021-extensive pneumomediastinum anterior middle and posterior regions.  Air dissecting to the pericardium without any compression to the heart.  Subcutaneous air also present.  No pulmonary embolism  CT Chest with oral contrast 01/03/21 - Per tech, unable to time oral contrast due to patient unable to swallow contrast on command. Unchanged  large pneumomediastinum and pneumopericardium with extension to neck. No mediastinal fluid collected.  Micro Data:    Antimicrobials:     Interim History / Subjective:  Unable to complete esophagram yesterday due to drowsiness. Precedex discontinued and on IV ativan per CIWA protocol. Awake and alert this morning.  Objective   Blood pressure (!) 143/97, pulse 94, temperature 99.9 F (37.7 C), temperature source Oral, resp. rate (!) 28, height 5\' 1"  (1.549 m), weight 57.3 kg, SpO2 99 %.        Intake/Output Summary (Last 24 hours) at 01/05/2021 0956 Last data filed at 01/05/2021 0600 Gross per 24 hour  Intake 2665.9 ml  Output 700 ml  Net 1965.9 ml   Filed Weights   01/03/21 2030 01/04/21 0500 01/05/21 0500  Weight: 54.9 kg 54.9 kg 57.3 kg   Physical Exam: General: Well-appearing, no acute distress HENT: [place], AT, OP clear, MMM Eyes: EOMI, no scleral icterus Respiratory: Crepitus in upper chest. Clear to auscultation bilaterally.  No crackles, wheezing or rales Cardiovascular: RRR, -M/R/G, no JVD GI: BS+, soft, nontender Extremities:-Edema,-tenderness Neuro: AAO x4, CNII-XII grossly intact, follows commands Skin: Intact, no rashes or bruising Psych: Normal mood, normal affect  Resolved Hospital Problem list     Assessment & Plan:   Pneumomediastinum Extensive pneumomediastinum noted on CTA.  In the setting of recent nausea and vomiting, concern for possible esophageal rupture as the etiology for these findings.  Discussed imaging with radiology with no apparent esophageal abnormalities however recommend oral contrast to evaluate. Unfortunately unable to time out oral contrast during second CT. No evidence of mediastinal fluid. Will pursue esophagram to confirm normal esophagus in patient prior to  starting PO meds and diet --Remain NPO --Repeat esophagram. Patient is more alert today --At risk for aspiration if she does have esophageal perforation  Metabolic acidosis  secondary to bicarbonate loss in setting of nausea and vomiting - improving --Discontinue sodium bicarb drip --Trend BMP --As needed Zofran  Alcohol withdrawal History of SI --IV ativan per CIWA protocol --IV thiamine, folic acid and multivitamin daily --Consider psychiatry evaluation when stable  Incidental COVID+, asymptomatic --No indication for antiviral or steroid treatment  Best practice (evaluated daily)  Diet: NPO Pain/Anxiety/Delirium protocol (if indicated): -- VAP protocol (if indicated): -- DVT prophylaxis: Heparin subq GI prophylaxis: PPI Glucose control: CBG q4h Mobility: As tolerated  Goals of Care:  Last date of multidisciplinary goals of care discussion: Family and staff present:  Summary of discussion:  Follow up goals of care discussion due: Code Status: Full code  Labs   CBC: Recent Labs  Lab 01/03/21 1307 01/04/21 0507  WBC 12.2* 5.5  NEUTROABS 11.5*  --   HGB 17.4* 12.2  HCT 53.7* 36.1  MCV 104.9* 102.0*  PLT 209 144*    Basic Metabolic Panel: Recent Labs  Lab 01/03/21 1307 01/04/21 0507 01/04/21 1629  NA 135 137 141  K 5.2* 3.3* 4.2  CL 99 107 103  CO2 9* 17* 25  GLUCOSE 212* 83 57*  BUN 12 8 7   CREATININE 1.24* 0.54 0.49  CALCIUM 10.3 8.7* 9.1  MG  --  1.8  --   PHOS  --  2.3*  --    GFR: Estimated Creatinine Clearance: 83.2 mL/min (by C-G formula based on SCr of 0.49 mg/dL). Recent Labs  Lab 01/03/21 1307 01/03/21 1605 01/04/21 0507  WBC 12.2*  --  5.5  LATICACIDVEN  --  2.1*  --     Liver Function Tests: Recent Labs  Lab 01/03/21 1307  AST 75*  ALT 61*  ALKPHOS 62  BILITOT 1.6*  PROT 10.9*  ALBUMIN 5.5*   No results for input(s): LIPASE, AMYLASE in the last 168 hours. Recent Labs  Lab 01/03/21 1605  AMMONIA 71*    ABG    Component Value Date/Time   PHART 7.267 (L) 01/03/2021 1759   PCO2ART 21.3 (L) 01/03/2021 1759   PO2ART 110 (H) 01/03/2021 1759   HCO3 9.4 (L) 01/03/2021 1759   ACIDBASEDEF 15.9  (H) 01/03/2021 1759   O2SAT 97.3 01/03/2021 1759     Coagulation Profile: Recent Labs  Lab 01/03/21 1304  INR 1.2    Cardiac Enzymes: No results for input(s): CKTOTAL, CKMB, CKMBINDEX, TROPONINI in the last 168 hours.  HbA1C: No results found for: HGBA1C  CBG: Recent Labs  Lab 01/04/21 1945 01/04/21 2037 01/04/21 2349 01/05/21 0350 01/05/21 0759  GLUCAP 52* 70 95 101* 76    Care time: 36 minutes    01/07/21, M.D. [facility] Pulmonary/Critical Care Medicine 01/05/2021 9:57 AM

## 2021-01-06 ENCOUNTER — Inpatient Hospital Stay (HOSPITAL_COMMUNITY): Payer: Self-pay

## 2021-01-06 ENCOUNTER — Telehealth: Payer: Self-pay | Admitting: Pulmonary Disease

## 2021-01-06 DIAGNOSIS — J9859 Other diseases of mediastinum, not elsewhere classified: Secondary | ICD-10-CM

## 2021-01-06 LAB — BASIC METABOLIC PANEL
Anion gap: 10 (ref 5–15)
BUN: <5 mg/dL — ABNORMAL LOW (ref 6–20)
CO2: 27 mmol/L (ref 22–32)
Calcium: 9.3 mg/dL (ref 8.9–10.3)
Chloride: 100 mmol/L (ref 98–111)
Creatinine, Ser: 0.51 mg/dL (ref 0.44–1.00)
GFR, Estimated: >60 mL/min (ref >60)
Glucose, Bld: 98 mg/dL (ref 70–99)
Potassium: 3.5 mmol/L (ref 3.5–5.1)
Sodium: 137 mmol/L (ref 135–145)

## 2021-01-06 LAB — GLUCOSE, CAPILLARY
Glucose-Capillary: 109 mg/dL — ABNORMAL HIGH (ref 70–99)
Glucose-Capillary: 70 mg/dL (ref 70–99)
Glucose-Capillary: 72 mg/dL (ref 70–99)
Glucose-Capillary: 75 mg/dL (ref 70–99)
Glucose-Capillary: 82 mg/dL (ref 70–99)

## 2021-01-06 MED ORDER — LOPERAMIDE HCL 2 MG PO CAPS
2.0000 mg | ORAL_CAPSULE | Freq: Once | ORAL | Status: AC
  Administered 2021-01-06: 2 mg via ORAL
  Filled 2021-01-06: qty 1

## 2021-01-06 MED ORDER — HYDROCHLOROTHIAZIDE 12.5 MG PO CAPS
12.5000 mg | ORAL_CAPSULE | Freq: Every day | ORAL | Status: DC
  Administered 2021-01-06 – 2021-01-07 (×2): 12.5 mg via ORAL
  Filled 2021-01-06 (×2): qty 1

## 2021-01-06 MED ORDER — POTASSIUM CHLORIDE CRYS ER 20 MEQ PO TBCR
40.0000 meq | EXTENDED_RELEASE_TABLET | Freq: Once | ORAL | Status: AC
  Administered 2021-01-06: 40 meq via ORAL
  Filled 2021-01-06: qty 2

## 2021-01-06 MED ORDER — HYDROCHLOROTHIAZIDE 10 MG/ML ORAL SUSPENSION: 12.5000 mg | Freq: Every day | ORAL | Status: DC

## 2021-01-06 NOTE — Progress Notes (Addendum)
This note is the H&P for this patient on 01/03/21    NAME:  Diane Dickson, MRN:  [id number], DOB:  June 21, 1996, LOS: 3 ADMISSION DATE:  01/03/2021, CONSULTATION DATE:  01/03/21 REFERRING MD: Arthor Captain, PA CHIEF COMPLAINT:  Pneumomediastinum  Brief History:  25 year old female with alcohol abuse who presents with alcohol withdrawal.  PCCM consulted for pneumomediastinum.  History of Present Illness:  Diane Dickson is a 25 year old female with alcohol abuse and prior history of suicidal ideation who presents generalized symptoms of nausea vomiting body aches and shortness of breath. She reports having more than 10 episodes of nonbloody emesis yesterday. She usually drinks 1/2 to 1 gallon liquor daily and decided to stop drinking 3 days ago. Often times with drinking heavily, she will vomit which is baseline for her.  In the ED she is tachycardic and hypertensive. Mild tachypnea with normal O2 saturations on room air. Labs reviewed. WBC 12.2, Hgb 17.4, likely hemo-concentrated. Electrolytes concerning for mild AKI with K5.5 CO2 9 BUN/creatinine 12/1.24. Ethanol level, Tylenol, salicylate nondetectable. D-dimer was noted to be elevated so CTA was ordered which demonstrated pneumomediastinum.  She has received total of 5 mg of Ativan in the last 4 hours.  Started on IV fluids, thiamine and antiemetics.  She intermittently follows commands.  Mother at bedside.  Updated on plan   Past Medical History:  Alcohol abuse prior suicidal ideation  Significant Hospital Events:    Consults:  PCCM  Procedures:  None  Significant Diagnostic Tests:  CTA 01/03/2021-extensive pneumomediastinum anterior middle and posterior regions.  Air dissecting to the pericardium without any compression to the heart.  Subcutaneous air also present.  No pulmonary embolism  CT Chest with oral contrast 01/03/21 - Per tech, unable to time oral contrast due to patient unable to swallow contrast on command. Unchanged  large pneumomediastinum and pneumopericardium with extension to neck. No mediastinal fluid collected.  Esophogram 2/23 - No esophageal perforation or explanation for pneumomediastinum  Micro Data:    Antimicrobials:     Interim History / Subjective:  Continues to require IV ativan for withdrawal  Alert and oriented this morning  Objective   Blood pressure (!) 163/98, pulse 81, temperature 99.3 F (37.4 C), temperature source Oral, resp. rate 17, height 5\' 1"  (1.549 m), weight 57.3 kg, SpO2 98 %.        Intake/Output Summary (Last 24 hours) at 01/06/2021 0826 Last data filed at 01/06/2021 0300 Gross per 24 hour  Intake 1539.45 ml  Output 750 ml  Net 789.45 ml   Filed Weights   01/03/21 2030 01/04/21 0500 01/05/21 0500  Weight: 54.9 kg 54.9 kg 57.3 kg   Physical Exam: General: Well-appearing, no acute distress HENT: [place], AT, OP clear, MMM Eyes: EOMI, no scleral icterus Respiratory: Crepitus present on upper chest. Clear to auscultation bilaterally.  No crackles, wheezing or rales Cardiovascular: RRR, -M/R/G, no JVD GI: BS+, soft, nontender Extremities:-Edema,-tenderness Neuro: AAO x4, CNII-XII grossly intact Skin: Intact, no rashes or bruising Psych: Normal mood, normal affect   Resolved Hospital Problem list   - Metabolic acidosis secondary to bicarbonate loss in setting of nausea and vomiting   Assessment & Plan:   Pneumomediastinum Extensive pneumomediastinum noted on CTA.  In the setting of recent nausea and vomiting, concern for possible esophageal rupture as the etiology for these findings.  Discussed imaging with radiology with no apparent esophageal abnormalities however recommend oral contrast to evaluate. Unfortunately unable to time out oral contrast during second CT. No evidence  of mediastinal fluid. Esophagram negative.   At this point the etiology of her pneumomediastinum remains unclear though I wonder if she developed alveolar rupture from significant  dry heaving during emesis. No suspicion for tracheobronchial injury, esophageal perforation, head/neck surgery and abdominal injury. She also does not have any known history of asthma or obstructive lung disease to predispose her to this injury.  --Reviewed AM CXR. Stable pneumomediastinum --Tolerating CLD. Will advance later today --Repeat CXR in am --Consult CTS for recommendations --Will arrange for follow-up CXR with Pulmonary in two weeks with me  Alcohol withdrawal History of SI --IV ativan per CIWA protocol --IV thiamine, folic acid and multivitamin daily --Consult psychiatry   Hypertension --Start HCTZ --Will need PCP follow-up. Patient's mother is aware  Nausea --As needed Zofran  Incidental COVID+, asymptomatic --No indication for antiviral or steroid treatment  Best practice (evaluated daily)  Diet: CLD. Advance as tolerated Pain/Anxiety/Delirium protocol (if indicated): -- VAP protocol (if indicated): -- DVT prophylaxis: Heparin subq GI prophylaxis: PPI Glucose control: CBG q4h Mobility: As tolerated Dispo: Transfer to [facility] to pick up tomorrow  Goals of Care:  Last date of multidisciplinary goals of care discussion: Family and staff present:  Summary of discussion:  Follow up goals of care discussion due: Code Status: Full code  Labs   CBC: Recent Labs  Lab 01/03/21 1307 01/04/21 0507  WBC 12.2* 5.5  NEUTROABS 11.5*  --   HGB 17.4* 12.2  HCT 53.7* 36.1  MCV 104.9* 102.0*  PLT 209 144*    Basic Metabolic Panel: Recent Labs  Lab 01/03/21 1307 01/04/21 0507 01/04/21 1629 01/05/21 0927  NA 135 137 141 137  K 5.2* 3.3* 4.2 3.2*  CL 99 107 103 95*  CO2 9* 17* 25 29  GLUCOSE 212* 83 57* 69*  BUN 12 8 7  <5*  CREATININE 1.24* 0.54 0.49 0.41*  CALCIUM 10.3 8.7* 9.1 8.8*  MG  --  1.8  --   --   PHOS  --  2.3*  --  2.6   GFR: Estimated Creatinine Clearance: 83.2 mL/min (A) (by C-G formula based on SCr of 0.41 mg/dL (L)). Recent Labs  Lab  01/03/21 1307 01/03/21 1605 01/04/21 0507  WBC 12.2*  --  5.5  LATICACIDVEN  --  2.1*  --     Liver Function Tests: Recent Labs  Lab 01/03/21 1307 01/05/21 0927  AST 75* 49*  ALT 61* 33  ALKPHOS 62 44  BILITOT 1.6* 1.4*  PROT 10.9* 7.0  ALBUMIN 5.5* 3.5   No results for input(s): LIPASE, AMYLASE in the last 168 hours. Recent Labs  Lab 01/03/21 1605  AMMONIA 71*    ABG    Component Value Date/Time   PHART 7.267 (L) 01/03/2021 1759   PCO2ART 21.3 (L) 01/03/2021 1759   PO2ART 110 (H) 01/03/2021 1759   HCO3 9.4 (L) 01/03/2021 1759   ACIDBASEDEF 15.9 (H) 01/03/2021 1759   O2SAT 97.3 01/03/2021 1759     Coagulation Profile: Recent Labs  Lab 01/03/21 1304  INR 1.2    Cardiac Enzymes: No results for input(s): CKTOTAL, CKMB, CKMBINDEX, TROPONINI in the last 168 hours.  HbA1C: No results found for: HGBA1C  CBG: Recent Labs  Lab 01/05/21 1614 01/05/21 2007 01/06/21 0002 01/06/21 0411 01/06/21 0729  GLUCAP 79 84 72 109* 82    Care time: 35 minutes    01/08/21, M.D. [facility] Pulmonary/Critical Care Medicine 01/06/2021 8:26 AM

## 2021-01-06 NOTE — Progress Notes (Signed)
Multiple occurences of patient's bed alarming for her getting out of bed unassisted by climbing over the bed rails. Educated patient to seriousness of safety precautions and her overall condition. Patient remains with flat affect or laughs at nurse when education provided. Bed alarm and floor mats remain in place. Charge nurse updated to patient's non-compliance with safety precautions.

## 2021-01-06 NOTE — Progress Notes (Signed)
Electrolyte replacement: K 3.5 Plan: kdur 40 x 1  Herby Abraham, Pharm.D 01/06/2021 11:09 AM

## 2021-01-06 NOTE — Progress Notes (Signed)
eLink Physician-Brief Progress Note Patient Name: AKEILA LANA DOB: 1996/09/01 MRN: [id number]   Date of Service  01/06/2021  HPI/Events of Note  Patient with loose stools x 3.  eICU Interventions  Imodium 2 mg po x 1 ordered        Shalini Mair U Kindrick Lankford 01/06/2021, 1:14 AM

## 2021-01-06 NOTE — Consult Note (Signed)
[address]       [place] [address]             [phone]                    LEALER MARSLAND [facility] Medical Record #[id number] Date of Birth: 1996-03-14  Referring: No ref. provider found Primary Care: Pcp, No Primary Cardiologist: No primary care provider on file.  Chief Complaint:    Chief Complaint  Patient presents with  . Sore Throat  . Generalized Body Aches    History of Present Illness:    Diane Dickson 25 y.o. female admitted with pneumomediastinum after an episode of emesis and wretching.  She has a hx of alcohol abuse, and drinks about 1/2-1gallon daily.  She attempted to stop drinking 3 days prior to presenting to the ED.  She had hx of suicidal ideation, and has had multiple ER visits for intoxication, and presents with Covid pneumonia, and pneumomediastinum.  She has undergone a esophagram which did not show any exstravation.  She is currently on a clear liquid diet, and does not have a leukocytosis.    History reviewed. No pertinent past medical history.  History reviewed. No pertinent surgical history.  Family History  Problem Relation Age of Onset  . Healthy Mother      Social History   Tobacco Use  Smoking Status Never Smoker  Smokeless Tobacco Never Used    Social History   Substance and Sexual Activity  Alcohol Use Yes   Comment: daily     No Known Allergies  Current Facility-Administered Medications  Medication Dose Route Frequency Provider Last Rate Last Admin  . Chlorhexidine Gluconate Cloth 2 % PADS 6 each  6 each Topical Daily Luciano Cutter, MD   6 each at 01/05/21 1219  . docusate sodium (COLACE) capsule 100 mg  100 mg Oral BID PRN Duayne Cal, NP      . folic acid (FOLVITE) tablet 1 mg  1 mg Oral Daily Duayne Cal, NP   1 mg at 01/06/21 1308  . heparin injection 5,000 Units  5,000 Units Subcutaneous Q8H Duayne Cal, NP   5,000 Units at 01/05/21 2132  . hydrochlorothiazide (MICROZIDE)  capsule 12.5 mg  12.5 mg Oral Daily Luciano Cutter, MD   12.5 mg at 01/06/21 1307  . labetalol (NORMODYNE) injection 10 mg  10 mg Intravenous Q2H PRN Karl Ito, MD   10 mg at 01/06/21 3382  . LORazepam (ATIVAN) tablet 1-4 mg  1-4 mg Oral Q1H PRN Duayne Cal, NP       Or  . LORazepam (ATIVAN) injection 1-4 mg  1-4 mg Intravenous Q1H PRN Duayne Cal, NP   4 mg at 01/06/21 0820  . multivitamin with minerals tablet 1 tablet  1 tablet Oral Daily Duayne Cal, NP   1 tablet at 01/06/21 1307  . ondansetron (ZOFRAN) injection 4 mg  4 mg Intravenous Q8H PRN Luciano Cutter, MD      . polyethylene glycol [facility] / GLYCOLAX) packet 17 g  17 g Oral Daily PRN Duayne Cal, NP      . thiamine tablet 100 mg  100 mg Oral Daily Duayne Cal, NP   100 mg at 01/06/21 1307    Review of Systems  Constitutional: Negative for fever.  Respiratory: Negative for shortness of breath.   Cardiovascular: Negative for chest pain.  Gastrointestinal: Positive for vomiting.    PHYSICAL EXAMINATION: BP 136/86   Pulse 94   Temp 99.5 F (37.5 C) (Oral)   Resp 14   Ht 5\' 1"  (1.549 m)   Wt 57.3 kg   SpO2 100%   BMI 23.87 kg/m   Physical Exam Constitutional:      General: She is not in acute distress.    Appearance: She is normal weight. She is not ill-appearing, toxic-appearing or diaphoretic.  HENT:     Head: Normocephalic and atraumatic.  Cardiovascular:     Rate and Rhythm: Tachycardia present.  Pulmonary:     Effort: Pulmonary effort is normal. No respiratory distress.  Abdominal:     General: There is no distension.  Skin:    General: Skin is dry.      Diagnostic Studies & Laboratory data:     Recent Radiology Findings:   CT CHEST WO CONTRAST  Result Date: 01/03/2021 CLINICAL DATA:  25 year old female with esophageal perforation. History of alcohol abuse. EXAM: CT CHEST WITHOUT CONTRAST TECHNIQUE: Multidetector CT imaging of the chest was performed following the  standard protocol without IV contrast. COMPARISON:  Earlier chest CT dated 01/03/2021. FINDINGS: Evaluation of this exam is limited in the absence of intravenous contrast. Cardiovascular: There is no cardiomegaly or pericardial effusion. There is a large pneumomediastinum and pneumopericardium as seen on the earlier CT. The thoracic aorta and central pulmonary arteries are grossly unremarkable. Mediastinum/Nodes: No hilar or mediastinal adenopathy. The esophagus is grossly unremarkable. No mediastinal fluid collection. Large pneumomediastinum with superior extension into the neck. Lungs/Pleura: No focal consolidation, or pleural effusion. Small amount of air extends into the fissures bilaterally similar to earlier CT. Minimal air is also noted along the posterior pleural surface of the lower lobes, extension from the pneumomediastinum. The central airways are patent. Upper Abdomen: Fatty liver. Musculoskeletal: No chest wall mass or suspicious bone lesions identified. IMPRESSION: 1. Large pneumomediastinum and pneumopericardium as seen on the earlier CT. No interval change. 2. No mediastinal fluid collection. 3. Fatty liver. Electronically Signed   By: Elgie CollardArash  Radparvar M.D.   On: 01/03/2021 19:19   CT Angio Chest PE W and/or Wo Contrast  Result Date: 01/03/2021 CLINICAL DATA:  Pain and positive D-dimer study EXAM: CT ANGIOGRAPHY CHEST WITH CONTRAST TECHNIQUE: Multidetector CT imaging of the chest was performed using the standard protocol during bolus administration of intravenous contrast. Multiplanar CT image reconstructions and MIPs were obtained to evaluate the vascular anatomy. CONTRAST:  65mL OMNIPAQUE IOHEXOL 350 MG/ML SOLN COMPARISON:  Chest radiograph January 03, 2021 FINDINGS: Cardiovascular: There is no appreciable pulmonary embolus. There is no thoracic aortic aneurysm or dissection. Visualized great vessels appear unremarkable. No evident pericardial effusion or pericardial thickening.  Mediastinum/Nodes: There is extensive pneumomediastinum which is seen in anterior, middle, and posterior mediastinal compartments. Subcutaneous air extends into the neck and supraclavicular regions bilaterally. Air dissects into the pericardium; there does not appear to be appreciable cardiac compression. There is no evident associated pneumothorax. Thyroid appears normal. No evident thoracic adenopathy. No esophageal lesions evident. Lungs/Pleura: No evident pneumothorax. No edema or airspace opacity. No pleural effusions. Trachea and major bronchial structures appear patent. Upper Abdomen: There is hepatic steatosis. Visualized upper abdominal structures otherwise appear unremarkable. Musculoskeletal: No blastic or lytic bone lesions. No chest wall mass. The pneumomediastinum tracking into the neck and upper anterior soft tissue chest regions is noted. Review of the MIP images confirms the above findings. IMPRESSION: 1. Extensive pneumomediastinum seen throughout the anterior, middle,  and posterior mediastinal regions is evident. Air appears to dissect into the pericardium without impression/compression of the heart. There is no associated fluid in the mediastinum. There is no appreciable pneumothorax. Air does dissect into the neck and supraclavicular regions bilaterally. 2. No demonstrable pulmonary embolus. No thoracic aortic aneurysm or dissection. 3.  No edema or airspace opacity.  No pleural effusions. 4.  No evident adenopathy. 5.  Hepatic steatosis. Electronically Signed   By: Bretta Bang III M.D.   On: 01/03/2021 15:52   CT ABDOMEN PELVIS W CONTRAST  Result Date: 01/03/2021 CLINICAL DATA:  25 year old female with nausea vomiting. EXAM: CT ABDOMEN AND PELVIS WITH CONTRAST TECHNIQUE: Multidetector CT imaging of the abdomen and pelvis was performed using the standard protocol following bolus administration of intravenous contrast. CONTRAST:  OMNIPAQUE IOHEXOL 300 MG/ML  SOLN COMPARISON:   Chest CT dated 01/03/2021. FINDINGS: Lower chest: Extensive pneumomediastinum and probable pneumopericardium better seen on the earlier chest CT. No intra-abdominal free air or free fluid. Hepatobiliary: Diffuse fatty infiltration of the liver. There is a 1 cm right hepatic cyst. No intrahepatic biliary dilatation. The gallbladder is unremarkable Pancreas: Unremarkable. No pancreatic ductal dilatation or surrounding inflammatory changes. Spleen: Normal in size without focal abnormality. Adrenals/Urinary Tract: Adrenal glands are unremarkable. Kidneys are normal, without renal calculi, focal lesion, or hydronephrosis. Bladder is unremarkable. Stomach/Bowel: The stomach is distended. No evidence of gastric outlet obstruction. Diffuse thickened appearance of the colon, likely related to underdistention. There is no bowel obstruction. The appendix is normal. Vascular/Lymphatic: The abdominal aorta and IVC unremarkable. No portal venous gas. There is no adenopathy. Reproductive: The uterus is anteverted. There is a 3 cm left ovarian dominant follicle or corpus luteum. The right ovary is unremarkable. High attenuating content in the vagina of indeterminate etiology. A vesico vaginal fistula is less likely. Correlation with history of recent procedure recommended. Other: None Musculoskeletal: No acute or significant osseous findings. IMPRESSION: 1. No acute intra-abdominopelvic pathology. No bowel obstruction. Normal appendix. 2. Fatty liver. 3. A 3 cm left ovarian dominant follicle or corpus luteum. 4. High attenuating content in the vagina of indeterminate etiology. Clinical correlation is recommended. Electronically Signed   By: Elgie Collard M.D.   On: 01/03/2021 19:09   DG CHEST PORT 1 VIEW  Result Date: 01/06/2021 CLINICAL DATA:  Pneumomediastinum. EXAM: PORTABLE CHEST 1 VIEW COMPARISON:  01/04/2021.  CT 01/03/2021. FINDINGS: Pneumomediastinum and pneumopericardium again noted. No interim change. Heart size  normal. Low lung volumes. No focal infiltrate. No pleural effusion or pneumothorax. Scoliosis thoracic spine. No acute bony abnormality. Bilateral supraclavicular and neck subcutaneous emphysema again noted. Similar findings noted on prior exam. IMPRESSION: Pneumomediastinum and pneumopericardium again noted. No interim change. Bilateral supraclavicular and neck subcutaneous emphysema again noted. Similar findings noted on prior exam. Low lung volumes. No focal infiltrate. Chest unchanged from prior exams. Electronically Signed   By: Maisie Fus  Register   On: 01/06/2021 05:31   DG Chest Port 1 View  Result Date: 01/04/2021 CLINICAL DATA:  COVID, pneumomediastinum EXAM: PORTABLE CHEST 1 VIEW COMPARISON:  Radiograph 01/03/2021, CT 01/31/2021 FINDINGS: Redemonstration of the extensive pneumomediastinum with gas extending through the thoracic inlet and into the cervical soft tissues. No clear pneumothorax is evident. Some atelectatic changes and low lung volumes are present. The remaining cardiomediastinal contours are unremarkable. No acute osseous or soft tissue abnormality. Telemetry leads overlie the chest. IMPRESSION: 1. Stable pneumomediastinum with subcutaneous emphysema at the base of neck. 2. Low lung volumes and atelectasis. Electronically Signed  By: Kreg Shropshire M.D.   On: 01/04/2021 05:14   DG Chest Portable 1 View  Result Date: 01/03/2021 CLINICAL DATA:  Complaining of sore throat, chills generalized body aches x1 day EXAM: PORTABLE CHEST 1 VIEW COMPARISON:  None. FINDINGS: The heart size and mediastinal contours are within normal limits. No focal consolidation. No pleural effusion. No pneumothorax. The visualized skeletal structures are unremarkable. Banded areas of punctate radiopaque densities over the superior thorax/neck related to ornamentation on patient's mask. IMPRESSION: No active disease. Electronically Signed   By: Maudry Mayhew MD   On: 01/03/2021 13:47   DG ESOPHAGUS W SINGLE CM (SOL  OR THIN BA)  Result Date: 01/05/2021 CLINICAL DATA:  Pneumomediastinum on prior CTs. EXAM: ESOPHOGRAM/BARIUM SWALLOW TECHNIQUE: Single contrast examination was performed using water-soluble, Omnipaque 300. FLUOROSCOPY TIME:  Fluoroscopy Time:  1 minutes and 42 seconds Radiation Exposure Index (if provided by the fluoroscopic device): 15.6 mGy Number of Acquired Spot Images: 0 COMPARISON:  Chest CT including 01/03/2021 FINDINGS: Focused, single-contrast exam performed with patient in RAO and RPO position. No esophageal contrast extravasation to suggest perforation. No hiatal hernia IMPRESSION: No esophageal perforation or explanation for pneumomediastinum. Electronically Signed   By: Jeronimo Greaves M.D.   On: 01/05/2021 16:35   DG ESOPHAGUS W SINGLE CM (SOL OR THIN BA)  Result Date: 01/04/2021 CLINICAL DATA:  Evaluate etiology of pneumomediastinum. EXAM: ESOPHOGRAM/BARIUM SWALLOW TECHNIQUE: Single contrast examination was performed using thin barium/water soluble. FLUOROSCOPY TIME:  Fluoroscopy Time:  6 seconds Radiation Exposure Index (if provided by the fluoroscopic device): 1.1 mGy Number of Acquired Spot Images: 0 COMPARISON:  CT chest 01/03/2021. FINDINGS: The patient was unable to be sufficiently arouse to safely perform this procedure. IMPRESSION: Unsuccessful procedure. Patient was unable to be sufficiently aroused to safely perform this procedure. Electronically Signed   By: Signa Kell M.D.   On: 01/04/2021 14:52       I have independently reviewed the above radiology studies  and reviewed the findings with the patient.   Recent Lab Findings: Lab Results  Component Value Date   WBC 5.5 01/04/2021   HGB 12.2 01/04/2021   HCT 36.1 01/04/2021   PLT 144 (L) 01/04/2021   GLUCOSE 98 01/06/2021   ALT 33 01/05/2021   AST 49 (H) 01/05/2021   NA 137 01/06/2021   K 3.5 01/06/2021   CL 100 01/06/2021   CREATININE 0.51 01/06/2021   BUN <5 (L) 01/06/2021   CO2 27 01/06/2021   INR 1.2  01/03/2021     Assessment / Plan:   25 yo female with a pneumomediastinum, after a episode of severe emesis.  Although impressive on cross-sectional imaging, I did not see any obvious bullous disease which could have led to a pneumothorax that necessitated into the mediastinum.  She likely had an microperforation in her esophagus which is no longer leaking based off of the esophagram.  Although she has a pneumopericardium, she is hemodynamically stable.  From a surgical standpoint, no intervention is require.  Given that she is tolerating clear liquids and no leukocytosis, I would recommended clinical surveillance. It will take at least 2 weeks for her imaging to resolve, thus would only repeat if her clinical status changes.       Corliss Skains 01/06/2021 1:36 PM

## 2021-01-06 NOTE — Telephone Encounter (Signed)
Staff message received:  Schedule for hospital follow-up Received: Today Luciano Cutter, MD  P [facility] Triage Pool Please schedule patient for March 8th 11:30 with me. Will need CXR before  appt has been scheduled and message placed that pt will need cxr prior to seeing Dr. Everardo All. Nothing further needed.

## 2021-01-06 NOTE — Consult Note (Signed)
[name] is a 25 year old female with alcohol abuse and prior history of suicidal ideation who presents generalized symptoms of nausea vomiting body aches and shortness of breath. She reports having more than 10 episodes of nonbloody emesis yesterday. She usually drinks 1/2 to 1 gallon liquor daily and decided to stop drinking 3 days ago. Often times with drinking heavily, she will vomit which is baseline for her.  In the ED she is tachycardic and hypertensive. Mild tachypnea with normal O2 saturations on room air. Labs reviewed. WBC 12.2, Hgb 17.4, likely hemo-concentrated. Electrolytes concerning for mild AKI with K5.5 CO2 9 BUN/creatinine 12/1.24. Ethanol level, Tylenol, salicylate nondetectable. D-dimer was noted to be elevated so CTA was ordered which demonstrated pneumomediastinum. She has received total of 5 mg of Ativan in the last 4 hours.  Started on IV fluids, thiamine and antiemetics.  She intermittently follows commands.  Mother at bedside.  Updated on plan    25 year old female who presented with nausea vomiting body aches and shortness of breath, subsequently diagnosed with pneumomediastinum and COVID-19.  Psychiatric consult was placed for alcohol abuse history of suicidal ideation, currently denies suicidal ideations.  Patient does express history of alcohol abuse, depression, bipolar disorder, and history of suicidal thoughts.  She has 1 previous emergency room visit 1 month ago for alcohol intoxication and suicidal thoughts, blood alcohol level at that time was 516.  She endorses ongoing suicidal thoughts related to her alcohol use.  She currently denies any outpatient psychiatric services.  She also denies any previous psychiatric medication.  She denies any previous suicide attempts, and or self-harm behaviors.  She also denies current suicidal thoughts, homicidal ideations, and or auditory visual hallucinations.  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  Patient denies any symptoms of depression, anxiety, irritability, hallucinations.  She is able to demonstrate understanding and appears coherent as she is able to hold a linear conversation, with improved affect this morning.  She reports she is amenable to receive outpatient substance use disorder treatment.  She does endorse protective factors to include family particularly her mother.  Patient currently resides with her grandmother and plans to go back to her after discharge.  She endorses good sleep and good appetite.  She denies any suicidal ideations, homicidal ideations, and or auditory visual hallucination.  I explored effects of alcohol on her mental health with her.  Patient does admit to negative effects, however uses alcohol to help cope.  She currently expresses much motivation to remain sober, and wishes to do so on an outpatient setting.  Risk associated with increased alcohol use, including increase in suicidal thoughts while under the influence.  Her mood has continued to improve since admission to the emergency room 2 days ago.  Patient has continued to refute suicidal thoughts this admission.  She does show ability to engage, and process her thoughts with this nurse practitioner.  Will establish relapse prevention measures at this time, patient will benefit from partial hospitalization program/chemical dependency intensive outpatient program at [facility]   -Psych cleared.  At this current time patient does not meet inpatient criteria.  She does not appear to be of imminent risk to self or others.  -Will place information and follow-up for [facility] partial hospitalization program and chemical dependency intensive outpatient program.  -Recommend working with social  work to  facilitate inpatient and outpatient substance abuse resources.  We did discuss the difficulty of completing substance abuse programs in an outpatient setting, patient is currently uninsured, however this does not have to be a barrier for her as there are multiple programs that are offered in the community.

## 2021-01-06 NOTE — TOC Progression Note (Signed)
Transition of Care [facility]) - Progression Note    Patient Details  Name: KEONNA RAETHER MRN: [id number] Date of Birth: 1996/05/23  [facility] of [facility] CM/SW Contact  Golda Acre, RN Phone Number: 01/06/2021, 7:15 AM  Clinical Narrative:    Significant Diagnostic Tests:  CTA 01/03/2021-extensive pneumomediastinum anterior middle and posterior regions.  Air dissecting to the pericardium without any compression to the heart.  Subcutaneous air also present.  No pulmonary embolism  CT Chest with oral contrast 01/03/21 - Per tech, unable to time oral contrast due to patient unable to swallow contrast on command. Unchanged large pneumomediastinum and pneumopericardium with extension to neck. No mediastinal fluid collected. Plan: will need substance abuse counseling information.  Plan is to return to home   Expected Discharge Plan: Home/Self Care Barriers to Discharge: Continued Medical Work up  Expected Discharge Plan and Services Expected Discharge Plan: Home/Self Care   Discharge Planning Services: CM Consult   Living arrangements for the past 2 months: Apartment                                       Social Determinants of Health (SDOH) Interventions    Readmission Risk Interventions No flowsheet data found.

## 2021-01-07 ENCOUNTER — Inpatient Hospital Stay (HOSPITAL_COMMUNITY): Payer: Self-pay

## 2021-01-07 DIAGNOSIS — I10 Essential (primary) hypertension: Secondary | ICD-10-CM

## 2021-01-07 DIAGNOSIS — F1010 Alcohol abuse, uncomplicated: Secondary | ICD-10-CM

## 2021-01-07 LAB — CBC WITH DIFFERENTIAL/PLATELET
Abs Immature Granulocytes: 0.01 10*3/uL (ref 0.00–0.07)
Basophils Absolute: 0 10*3/uL (ref 0.0–0.1)
Basophils Relative: 0 %
Eosinophils Absolute: 0.1 10*3/uL (ref 0.0–0.5)
Eosinophils Relative: 2 %
HCT: 34.8 % — ABNORMAL LOW (ref 36.0–46.0)
Hemoglobin: 11.8 g/dL — ABNORMAL LOW (ref 12.0–15.0)
Immature Granulocytes: 0 %
Lymphocytes Relative: 46 %
Lymphs Abs: 2 10*3/uL (ref 0.7–4.0)
MCH: 34.6 pg — ABNORMAL HIGH (ref 26.0–34.0)
MCHC: 33.9 g/dL (ref 30.0–36.0)
MCV: 102.1 fL — ABNORMAL HIGH (ref 80.0–100.0)
Monocytes Absolute: 0.4 10*3/uL (ref 0.1–1.0)
Monocytes Relative: 8 %
Neutro Abs: 1.9 10*3/uL (ref 1.7–7.7)
Neutrophils Relative %: 44 %
Platelets: 232 10*3/uL (ref 150–400)
RBC: 3.41 MIL/uL — ABNORMAL LOW (ref 3.87–5.11)
RDW: 12.6 % (ref 11.5–15.5)
WBC: 4.3 10*3/uL (ref 4.0–10.5)
nRBC: 0 % (ref 0.0–0.2)

## 2021-01-07 LAB — BASIC METABOLIC PANEL
Anion gap: 8 (ref 5–15)
BUN: 9 mg/dL (ref 6–20)
CO2: 27 mmol/L (ref 22–32)
Calcium: 9.2 mg/dL (ref 8.9–10.3)
Chloride: 103 mmol/L (ref 98–111)
Creatinine, Ser: 0.52 mg/dL (ref 0.44–1.00)
GFR, Estimated: >60 mL/min (ref >60)
Glucose, Bld: 101 mg/dL — ABNORMAL HIGH (ref 70–99)
Potassium: 3.5 mmol/L (ref 3.5–5.1)
Sodium: 138 mmol/L (ref 135–145)

## 2021-01-07 MED ORDER — FOLIC ACID 1 MG PO TABS: 1.0000 mg | ORAL_TABLET | Freq: Every day | ORAL | 0 refills | Status: AC

## 2021-01-07 MED ORDER — HYDROCHLOROTHIAZIDE 12.5 MG PO CAPS: 12.5000 mg | ORAL_CAPSULE | Freq: Every day | ORAL | 0 refills | Status: DC

## 2021-01-07 MED ORDER — ADULT MULTIVITAMIN W/MINERALS CH: 1.0000 | ORAL_TABLET | Freq: Every day | ORAL | 0 refills | Status: AC

## 2021-01-07 MED ORDER — THIAMINE HCL 100 MG PO TABS: 100.0000 mg | ORAL_TABLET | Freq: Every day | ORAL | 0 refills | Status: AC

## 2021-01-07 NOTE — Progress Notes (Signed)
Patient belongings given to the patient. Patient is free of pain and vital signs are stable. Patient discharge instructions and education provided by the RN. Patient verbalized understanding. Patient was taken down by wheelchair and safely assisted into their vehicle.  

## 2021-01-07 NOTE — Clinical Note (Incomplete)
Patient belongings given to the patient. Patient is free of pain and vital signs are stable. Patient discharge instructions and education provided by the RN. Patient verbalized understanding. Patient was taken down by wheelchair and safely assisted into their vehicle.

## 2021-01-07 NOTE — Discharge Instructions (Signed)
Alcohol Withdrawal Syndrome When a person who drinks a lot of alcohol stops drinking, he or she may have unpleasant and serious symptoms. These symptoms are called alcohol withdrawal syndrome. This condition may be mild or severe. It can be life-threatening. It can cause:  Shaking that you cannot control (tremor).  Sweating.  Headache.  Feeling fearful, upset, grouchy, or depressed.  Trouble sleeping (insomnia).  Nightmares.  Fast or uneven heartbeats (palpitations).  Alcohol cravings.  Feeling sick to your stomach (nausea).  Throwing up (vomiting).  Being bothered by light and sounds.  Confusion.  Trouble thinking clearly.  Not being hungry (loss of appetite).  Big changes in mood (mood swings). If you have all of the following symptoms at the same time, get help right away:  High blood pressure.  Fast heartbeat.  Trouble breathing.  Seizures.  Seeing, hearing, feeling, smelling, or tasting things that are not there (hallucinations). These symptoms are known as delirium tremens (DTs). They must be treated at the hospital right away. Follow these instructions at home:  Take over-the-counter and prescription medicines only as told by your doctor. This includes vitamins.  Do not drink alcohol.  Do not drive until your doctor says that this is safe for you.  Have someone stay with you or be available in case you need help. This should be someone you trust. This person can help you with your symptoms. He or she can also help you to not drink.  Drink enough fluid to keep your pee (urine) pale yellow.  Think about joining a support group or a treatment program to help you stop drinking.  Keep all follow-up visits as told by your doctor. This is important.   Contact a doctor if:  Your symptoms get worse.  You cannot eat or drink without throwing up.  You have a hard time not drinking alcohol.  You cannot stop drinking alcohol. Get help right away  if:  You have fast or uneven heartbeats.  You have chest pain.  You have trouble breathing.  You have a seizure for the first time.  You see, hear, feel, smell, or taste something that is not there.  You get very confused. Summary  When a person who drinks a lot of alcohol stops drinking, he or she may have serious symptoms. This is called alcohol withdrawal syndrome.  Delirium tremens (DTs) is a group of life-threatening symptoms. You should get help right away if you have these symptoms.  Think about joining an alcohol support group or a treatment program. This information is not intended to replace advice given to you by your health care provider. Make sure you discuss any questions you have with your health care provider. Document Revised: 10/12/2017 Document Reviewed: 07/06/2017 Elsevier Patient Education  2021 [employer]   Pneumomediastinum  Pneumomediastinum is the presence of air in the mediastinum. This is the area of the body that is between the lungs and behind the breastbone. Mild cases of this condition may not cause problems. Severe cases can interfere with the normal functions of your heart and lungs. What are the causes? This condition happens when air leaks out of your lungs, airways, or intestines and into your mediastinum. This condition may be caused by:  Childbirth.  An injury to your chest, lung, intestine, esophagus, or abdomen.  Asthma.  Rapid ascent during scuba diving.  Use of a breathing machine (ventilator).  Inhaling or ingesting certain drugs or chemicals.  An infection in your face, neck, chest, or abdomen.  Extreme strain during coughing or vomiting.  Breathing an object into an airway. This condition can also occur without a cause. What are the signs or symptoms? Symptoms of this condition include:  Chest pain. The pain may run into your neck, shoulder, back, or arms.  Increased pain when you move, swallow, or take a deep  breath.  Problems swallowing.  Problems speaking.  Changes in your voice.  Shortness of breath.  Fever.  Throat or jaw pain. Some people have no symptoms. This is often the case if the condition occurred on its own. How is this diagnosed? This condition may be diagnosed based on:  Your symptoms.  A physical exam.  Imaging tests, such as a chest X-ray or CT scan. How is this treated? Treatment depends on how severe the condition is and whether there are complications.  If your condition is mild, you may not need treatment. Your body may slowly reabsorb the air in your mediastinum. You will stay in the hospital for observation and get medicine for pain, if you have pain.  If your condition is severe, or if the air starts to put pressure on your heart or lungs, you may need: ? Treatment for the underlying cause. ? One or more of the following procedures:  Needle aspiration. In this procedure, a needle is used to remove trapped air.  Chest tube placement. This may be done if your lung collapses.  Surgery. This may be done to repair a hole in your intestine or esophagus. Follow these instructions at home:  Until your health care provider says it is okay, avoid: ? Air travel. ? Scuba diving. ? High altitudes. ? Hard physical work. ? Exercise.  Avoid any movements that make you strain your muscles. Try not to cough, laugh hard, or lift anything heavy.  Do not use any products that contain nicotine or tobacco, such as cigarettes and e-cigarettes. If you need help quitting, ask your health care provider.  Do not use illegal drugs.  Take over-the-counter and prescription medicines only as told by your health care provider.   Contact a health care provider if:  You have a fever. Get help right away if you have:  Worsening pain in your chest, neck, jaw, or arms.  Trouble breathing.  New problems with speaking or swallowing. Summary  Pneumomediastinum is the  presence of air in the mediastinum. This is the area of the body that is between the lungs and behind the breastbone. This can happen if air leaks out of your lungs, airways, or intestines and into your mediastinum.  Symptoms may include chest, throat or jaw pain, increased pain when you move, swallow, or take a deep breath, changes in your voice, shortness of breath, and fever.  Treatment depends on how severe your condition is and if there are complications. This information is not intended to replace advice given to you by your health care provider. Make sure you discuss any questions you have with your health care provider. Document Revised: 12/05/2017 Document Reviewed: 12/05/2017 Elsevier Patient Education  2021 [employer].

## 2021-01-07 NOTE — Discharge Summary (Signed)
Discharge Summary  Diane Dickson:[id number] DOB: 07/06/1996  PCP: Pcp, No  Admit date: 01/03/2021 Discharge date: 01/07/2021  Time spent: 40 mins  Recommendations for Outpatient Follow-up:  1. Advised to establish care with [facility] in 1 week 2. Follow-up with pulmonology to repeat imaging in for follow-up 3. Follow-up with [facility]  Discharge Diagnoses:  Active Hospital Problems   Diagnosis Date Noted  . Pneumomediastinum (HCC) 01/03/2021    Resolved Hospital Problems  No resolved problems to display.    Discharge Condition: Stable   Diet recommendation: Regular   Vitals:   01/07/21 1212 01/07/21 1600  BP: (!) 154/99   Pulse:    Resp:    Temp:  98.2 F (36.8 C)  SpO2:      History of present illness:  Diane Dickson is a 25 year old female with alcohol abuse and prior history of suicidal ideation who presents generalized symptoms of nausea vomiting body aches and shortness of breath. She reports having more than 10 episodes of nonbloody emesis. She usually drinks 1/2 to 1 gallon liquor daily and decided to stop drinking 3 days ago PTA. Often times with drinking heavily, she will vomit which is baseline for her. In the ED she was tachycardic and hypertensive. Mild tachypnea with normal O2 saturations on room air. Labs reviewed. WBC 12.2, Hgb 17.4, likely hemo-concentrated. Electrolytes concerning for mild AKI with K5.5 CO2 9 BUN/creatinine 12/1.24. Ethanol level, Tylenol, salicylate nondetectable. D-dimer was noted to be elevated so CTA was ordered which demonstrated pneumomediastinum.  Patient admitted to ICU for further management.    Today, patient denies any new complaints, denies any chest pain, shortness of breath, CIWA score 0, no longer requiring as needed Ativan, denies any difficulty swallowing, abdominal pain, nausea/vomiting, fever/chills.  Patient was able to tolerate her regular  diet and ambulate around the room without any issues.  Discussed with cardiothoracic surgeon, Dr. Cliffton Asters, patient okay to discharge from their standpoint with follow-up imaging in about 2 weeks.    Hospital Course:  Active Problems:   Pneumomediastinum (HCC)  Pneumomediastinum Extensive pneumomediastinum noted on CTA Etiology of her pneumomediastinum remains unclear though I wonder if she developed alveolar rupture from significant dry heaving during emesis Repeat imaging showed stable pneumomediastinum Cardiothoracic surgeon Dr. Cliffton Asters recommended imaging in 2 weeks to monitor for resolution Follow-up with pulmonology in 2 weeks with repeat imaging to assess for resolution  Alcohol withdrawal History of SI S/P CIWA protocol D/C on thiamine, folic acid and multivitamin Psychiatry consulted, does not require psychiatric hospitalization and recommend referral to outpatient substance abuse treatment program Denies any SI/HI  Hypertension BP still uncontrolled Continue HCTZ daily Follow-up with PCP for further management  Incidental COVID+, asymptomatic Currently on room air, saturating well No indication for antiviral or steroid treatment     Malnutrition Type:  Nutrition Problem: Inadequate oral intake Etiology: inability to eat   Malnutrition Characteristics:  Signs/Symptoms: NPO status   Nutrition Interventions:  Interventions: Refer to RD note for recommendations   Estimated body mass index is 24.62 kg/m as calculated from the following:   Height as of this encounter: 5\' 1"  (1.549 m).   Weight as of this encounter: 59.1 kg.    Procedures:  None  Consultations:  PCCM  Cardiothoracic surgeon    Discharge Exam: BP (!) 154/99   Pulse 84   Temp 98.2 F (36.8 C) (Oral)   Resp 18   Ht 5\' 1"  (1.549 m)  Wt 59.1 kg   SpO2 100%   BMI 24.62 kg/m   General: NAD Cardiovascular: S1, S2 present Respiratory: CTA B     Discharge  Instructions You were cared for by a hospitalist during your hospital stay. If you have any questions about your discharge medications or the care you received while you were in the hospital after you are discharged, you can call the unit and asked to speak with the hospitalist on call if the hospitalist that took care of you is not available. Once you are discharged, your primary care physician will handle any further medical issues. Please note that NO REFILLS for any discharge medications will be authorized once you are discharged, as it is imperative that you return to your primary care physician (or establish a relationship with a primary care physician if you do not have one) for your aftercare needs so that they can reassess your need for medications and monitor your lab values.  Discharge Instructions    Diet - low sodium heart healthy   Complete by: As directed    Increase activity slowly   Complete by: As directed      Allergies as of 01/07/2021   No Known Allergies     Medication List    STOP taking these medications   Hibiclens 4 % external liquid Generic drug: chlorhexidine   lidocaine 2 % solution Commonly known as: XYLOCAINE   metroNIDAZOLE 500 MG tablet Commonly known as: FLAGYL     TAKE these medications   folic acid 1 MG tablet Commonly known as: FOLVITE Take 1 tablet (1 mg total) by mouth daily. Start taking on: January 08, 2021   hydrochlorothiazide 12.5 MG capsule Commonly known as: MICROZIDE Take 1 capsule (12.5 mg total) by mouth daily. Start taking on: January 08, 2021   multivitamin with minerals Tabs tablet Take 1 tablet by mouth daily. Start taking on: January 08, 2021   thiamine 100 MG tablet Take 1 tablet (100 mg total) by mouth daily. Start taking on: January 08, 2021      No Known Allergies  Follow-up Information    [facility] Follow [facility].   Specialty: Behavioral Health Why: partial hospitalization  program-  Contact information: [address] [place] [id number] [phone]       [facility]. Call in 1 day(s).   Specialty: Behavioral Health Why: Schedul an appointment for alcohol use intensive outpatient programming.  Contact information: [address] [place] [id number] [phone]       [facility]. Call.   Why: Call to make an appointment with community clinic to follow up on your annual Contact information: [address] [place] [phone]       [facility]iano CutterEllison, Chi Jane, MD Follow up.   Specialty: Pulmonary Disease Why: They will call for a follow up appointment and to repeat your imaging Contact information: [address] [place] [id number] [phone]                The results of significant diagnostics from this hospitalization (including imaging, microbiology, ancillary and laboratory) are listed below for reference.    Significant Diagnostic Studies: CT CHEST WO CONTRAST  Result Date: 01/03/2021 CLINICAL DATA:  25 year old female with esophageal perforation. History of alcohol abuse. EXAM: CT CHEST WITHOUT CONTRAST TECHNIQUE: Multidetector CT imaging of the chest was performed following the standard protocol without IV contrast. COMPARISON:  Earlier chest CT dated 01/03/2021. FINDINGS: Evaluation of this exam is limited in the absence of intravenous contrast. Cardiovascular: There is no cardiomegaly or pericardial effusion. There is a large pneumomediastinum and pneumopericardium as seen on the earlier CT. The thoracic aorta and central pulmonary arteries are grossly unremarkable. Mediastinum/Nodes: No hilar or mediastinal adenopathy. The esophagus is grossly unremarkable. No mediastinal fluid collection. Large pneumomediastinum with superior extension into the neck. Lungs/Pleura: No focal consolidation, or pleural effusion. Small  amount of air extends into the fissures bilaterally similar to earlier CT. Minimal air is also noted along the posterior pleural surface of the lower lobes, extension from the pneumomediastinum. The central airways are patent. Upper Abdomen: Fatty liver. Musculoskeletal: No chest wall mass or suspicious bone lesions identified. IMPRESSION: 1. Large pneumomediastinum and pneumopericardium as seen on the earlier CT. No interval change. 2. No mediastinal fluid collection. 3. Fatty liver. Electronically Signed   By: Elgie Collard M.D.   On: 01/03/2021 19:19   CT Angio Chest PE W and/or Wo Contrast  Result Date: 01/03/2021 CLINICAL DATA:  Pain and positive D-dimer study EXAM: CT ANGIOGRAPHY CHEST WITH CONTRAST TECHNIQUE: Multidetector CT imaging of the chest was performed using the standard protocol during bolus administration of intravenous contrast. Multiplanar CT image reconstructions and MIPs were obtained to evaluate the vascular anatomy. CONTRAST:  31mL OMNIPAQUE IOHEXOL 350 MG/ML SOLN COMPARISON:  Chest radiograph January 03, 2021 FINDINGS: Cardiovascular: There is no appreciable pulmonary embolus. There is no thoracic aortic aneurysm or dissection. Visualized great vessels appear unremarkable. No evident pericardial effusion or pericardial thickening. Mediastinum/Nodes: There is extensive pneumomediastinum which is seen in anterior, middle, and posterior mediastinal compartments. Subcutaneous air extends into the neck and supraclavicular regions bilaterally. Air dissects into the pericardium; there does not appear to be appreciable cardiac compression. There is no evident associated pneumothorax. Thyroid appears normal. No evident thoracic adenopathy. No esophageal lesions evident. Lungs/Pleura: No evident pneumothorax. No edema or airspace opacity. No pleural effusions. Trachea and major bronchial structures appear patent. Upper Abdomen: There is hepatic steatosis. Visualized upper abdominal structures  otherwise appear unremarkable. Musculoskeletal: No blastic or lytic bone lesions. No chest wall mass. The pneumomediastinum tracking into the neck and upper anterior soft tissue chest regions is noted. Review of the MIP images confirms the above findings. IMPRESSION: 1. Extensive pneumomediastinum seen throughout the anterior, middle, and posterior mediastinal regions is evident. Air appears to dissect into the pericardium without impression/compression of the heart. There is no associated fluid in the mediastinum. There is no appreciable pneumothorax. Air does dissect into the neck and supraclavicular regions bilaterally. 2. No demonstrable pulmonary embolus. No thoracic aortic aneurysm or dissection. 3.  No edema or airspace opacity.  No pleural effusions. 4.  No evident adenopathy. 5.  Hepatic steatosis. Electronically Signed   By: Bretta Bang III M.D.   On: 01/03/2021 15:52   CT ABDOMEN PELVIS W CONTRAST  Result Date: 01/03/2021 CLINICAL DATA:  25 year old female with nausea vomiting. EXAM: CT ABDOMEN AND PELVIS WITH CONTRAST TECHNIQUE: Multidetector CT imaging of the abdomen and pelvis was performed using the standard protocol following bolus administration of intravenous contrast. CONTRAST:  OMNIPAQUE IOHEXOL 300 MG/ML  SOLN COMPARISON:  Chest CT dated 01/03/2021. FINDINGS: Lower chest: Extensive pneumomediastinum and probable pneumopericardium better seen on the earlier chest CT. No intra-abdominal free air or free fluid. Hepatobiliary: Diffuse fatty infiltration of the liver. There is a 1 cm right hepatic cyst. No intrahepatic biliary dilatation. The gallbladder is unremarkable Pancreas: Unremarkable. No  pancreatic ductal dilatation or surrounding inflammatory changes. Spleen: Normal in size without focal abnormality. Adrenals/Urinary Tract: Adrenal glands are unremarkable. Kidneys are normal, without renal calculi, focal lesion, or hydronephrosis. Bladder is unremarkable. Stomach/Bowel: The  stomach is distended. No evidence of gastric outlet obstruction. Diffuse thickened appearance of the colon, likely related to underdistention. There is no bowel obstruction. The appendix is normal. Vascular/Lymphatic: The abdominal aorta and IVC unremarkable. No portal venous gas. There is no adenopathy. Reproductive: The uterus is anteverted. There is a 3 cm left ovarian dominant follicle or corpus luteum. The right ovary is unremarkable. High attenuating content in the vagina of indeterminate etiology. A vesico vaginal fistula is less likely. Correlation with history of recent procedure recommended. Other: None Musculoskeletal: No acute or significant osseous findings. IMPRESSION: 1. No acute intra-abdominopelvic pathology. No bowel obstruction. Normal appendix. 2. Fatty liver. 3. A 3 cm left ovarian dominant follicle or corpus luteum. 4. High attenuating content in the vagina of indeterminate etiology. Clinical correlation is recommended. Electronically Signed   By: Elgie Collard M.D.   On: 01/03/2021 19:09   DG CHEST PORT 1 VIEW  Result Date: 01/07/2021 CLINICAL DATA:  Pneumomediastinum EXAM: PORTABLE CHEST 1 VIEW COMPARISON:  Radiograph 01/06/2021, CT 01/03/2021 FINDINGS: Persistent pneumomediastinum and subcutaneous emphysema at the base of the neck, not significantly changed from prior. Remaining cardiomediastinal contours are stable as well. Persistently low lung volumes with some atelectatic change. No layering pleural effusion. No other acute soft tissue abnormality. No acute osseous abnormality or suspicious osseous lesion. IMPRESSION: 1. Persistent pneumomediastinum and subcutaneous emphysema at the base of the neck, not significantly changed from prior. Electronically Signed   By: Kreg Shropshire M.D.   On: 01/07/2021 05:07   DG CHEST PORT 1 VIEW  Result Date: 01/06/2021 CLINICAL DATA:  Pneumomediastinum. EXAM: PORTABLE CHEST 1 VIEW COMPARISON:  01/04/2021.  CT 01/03/2021. FINDINGS:  Pneumomediastinum and pneumopericardium again noted. No interim change. Heart size normal. Low lung volumes. No focal infiltrate. No pleural effusion or pneumothorax. Scoliosis thoracic spine. No acute bony abnormality. Bilateral supraclavicular and neck subcutaneous emphysema again noted. Similar findings noted on prior exam. IMPRESSION: Pneumomediastinum and pneumopericardium again noted. No interim change. Bilateral supraclavicular and neck subcutaneous emphysema again noted. Similar findings noted on prior exam. Low lung volumes. No focal infiltrate. Chest unchanged from prior exams. Electronically Signed   By: Maisie Fus  Register   On: 01/06/2021 05:31   DG Chest Port 1 View  Result Date: 01/04/2021 CLINICAL DATA:  COVID, pneumomediastinum EXAM: PORTABLE CHEST 1 VIEW COMPARISON:  Radiograph 01/03/2021, CT 01/31/2021 FINDINGS: Redemonstration of the extensive pneumomediastinum with gas extending through the thoracic inlet and into the cervical soft tissues. No clear pneumothorax is evident. Some atelectatic changes and low lung volumes are present. The remaining cardiomediastinal contours are unremarkable. No acute osseous or soft tissue abnormality. Telemetry leads overlie the chest. IMPRESSION: 1. Stable pneumomediastinum with subcutaneous emphysema at the base of neck. 2. Low lung volumes and atelectasis. Electronically Signed   By: Kreg Shropshire M.D.   On: 01/04/2021 05:14   DG Chest Portable 1 View  Result Date: 01/03/2021 CLINICAL DATA:  Complaining of sore throat, chills generalized body aches x1 day EXAM: PORTABLE CHEST 1 VIEW COMPARISON:  None. FINDINGS: The heart size and mediastinal contours are within normal limits. No focal consolidation. No pleural effusion. No pneumothorax. The visualized skeletal structures are unremarkable. Banded areas of punctate radiopaque densities over the superior thorax/neck related to ornamentation on patient's mask. IMPRESSION: No active disease. Electronically  Signed   By: Maudry Mayhew MD   On: 01/03/2021 13:47   DG ESOPHAGUS W SINGLE CM (SOL OR THIN BA)  Result Date: 01/05/2021 CLINICAL DATA:  Pneumomediastinum on prior CTs. EXAM: ESOPHOGRAM/BARIUM SWALLOW TECHNIQUE: Single contrast examination was performed using water-soluble, Omnipaque 300. FLUOROSCOPY TIME:  Fluoroscopy Time:  1 minutes and 42 seconds Radiation Exposure Index (if provided by the fluoroscopic device): 15.6 mGy Number of Acquired Spot Images: 0 COMPARISON:  Chest CT including 01/03/2021 FINDINGS: Focused, single-contrast exam performed with patient in RAO and RPO position. No esophageal contrast extravasation to suggest perforation. No hiatal hernia IMPRESSION: No esophageal perforation or explanation for pneumomediastinum. Electronically Signed   By: Jeronimo Greaves M.D.   On: 01/05/2021 16:35   DG ESOPHAGUS W SINGLE CM (SOL OR THIN BA)  Result Date: 01/04/2021 CLINICAL DATA:  Evaluate etiology of pneumomediastinum. EXAM: ESOPHOGRAM/BARIUM SWALLOW TECHNIQUE: Single contrast examination was performed using thin barium/water soluble. FLUOROSCOPY TIME:  Fluoroscopy Time:  6 seconds Radiation Exposure Index (if provided by the fluoroscopic device): 1.1 mGy Number of Acquired Spot Images: 0 COMPARISON:  CT chest 01/03/2021. FINDINGS: The patient was unable to be sufficiently arouse to safely perform this procedure. IMPRESSION: Unsuccessful procedure. Patient was unable to be sufficiently aroused to safely perform this procedure. Electronically Signed   By: Signa Kell M.D.   On: 01/04/2021 14:52    Microbiology: Recent Results (from the past 240 hour(s))  SARS CORONAVIRUS 2 (TAT 6-24 HRS) Nasopharyngeal Nasopharyngeal Swab     Status: Abnormal   Collection Time: 01/03/21 12:53 PM   Specimen: Nasopharyngeal Swab  Result Value Ref Range Status   SARS Coronavirus 2 POSITIVE (A) NEGATIVE Final    Comment: (NOTE) SARS-CoV-2 target nucleic acids are DETECTED.  The SARS-CoV-2 RNA is  generally detectable in upper and lower respiratory specimens during the acute phase of infection. Positive results are indicative of the presence of SARS-CoV-2 RNA. Clinical correlation with patient history and other diagnostic information is  necessary to determine patient infection status. Positive results do not rule out bacterial infection or co-infection with other viruses.  The expected result is Negative.  Fact Sheet for Patients: HairSlick.no  Fact Sheet for Healthcare Providers: quierodirigir.com  This test is not yet approved or cleared by the [place] FDA and  has been authorized for detection and/or diagnosis of SARS-CoV-2 by FDA under an Emergency Use Authorization (EUA). This EUA will remain  in effect (meaning this test can be used) for the duration of the COVID-19 declaration under Section 564(b)(1) of the Act, 21 U. S.C. section 360bbb-3(b)(1), unless the authorization is terminated or revoked sooner.   Performed at [facility], [address] [place], [place] [address]   Resp Panel by RT-PCR (Flu A&B, Covid) Nasopharyngeal Swab     Status: Abnormal   Collection Time: 01/03/21  4:06 PM   Specimen: Nasopharyngeal Swab; Nasopharyngeal(NP) swabs in vial transport medium  Result Value Ref Range Status   SARS Coronavirus 2 by RT PCR POSITIVE (A) NEGATIVE Final    Comment: RESULT CALLED TO, READ BACK BY AND VERIFIED WITH: BARBARA MAY @ 1947 ON 01/03/21 C VARNER (NOTE) SARS-CoV-2 target nucleic acids are DETECTED.  The SARS-CoV-2 RNA is generally detectable in upper respiratory specimens during the acute phase of infection. Positive results are indicative of the presence of the identified virus, but do not rule out bacterial infection or co-infection with other pathogens not detected by the test. Clinical correlation with patient history and other diagnostic  information is necessary to determine  patient infection status. The expected result is Negative.  Fact Sheet for Patients: BloggerCourse.com  Fact Sheet for Healthcare Providers: SeriousBroker.it  This test is not yet approved or cleared by the [place] FDA and  has been authorized for detection and/or diagnosis of SARS-CoV-2 by FDA under an Emergency Use Authorization (EUA).  This EUA will remain in effect (meaning this test c an be used) for the duration of  the COVID-19 declaration under Section 564(b)(1) of the Act, 21 U.S.C. section 360bbb-3(b)(1), unless the authorization is terminated or revoked sooner.     Influenza A by PCR NEGATIVE NEGATIVE Final   Influenza B by PCR NEGATIVE NEGATIVE Final    Comment: (NOTE) The Xpert Xpress SARS-CoV-2/FLU/RSV plus assay is intended as an aid in the diagnosis of influenza from Nasopharyngeal swab specimens and should not be used as a sole basis for treatment. Nasal washings and aspirates are unacceptable for Xpert Xpress SARS-CoV-2/FLU/RSV testing.  Fact Sheet for Patients: BloggerCourse.com  Fact Sheet for Healthcare Providers: SeriousBroker.it  This test is not yet approved or cleared by the [place] FDA and has been authorized for detection and/or diagnosis of SARS-CoV-2 by FDA under an Emergency Use Authorization (EUA). This EUA will remain in effect (meaning this test can be used) for the duration of the COVID-19 declaration under Section 564(b)(1) of the Act, 21 U.S.C. section 360bbb-3(b)(1), unless the authorization is terminated or revoked.  Performed at [facility], [address] [place], [place] [address]   Blood Culture (routine x 2)     Status: None (Preliminary result)   Collection Time: 01/03/21  5:00 PM   Specimen: BLOOD  Result Value Ref Range Status   Specimen Description   Final    BLOOD LEFT ANTECUBITAL Performed  at [facility], [address], [place], [place] [address]    Special Requests   Final    BOTTLES DRAWN AEROBIC AND ANAEROBIC Blood Culture adequate volume Performed at [facility], [address], [place], [place] [address]    Culture   Final    NO GROWTH 4 DAYS Performed at [facility], [address], [place], [place] [address]    Report Status PENDING  Incomplete  Urine culture     Status: Abnormal   Collection Time: 01/03/21  5:04 PM   Specimen: In/Out Cath Urine  Result Value Ref Range Status   Specimen Description   Final    IN/OUT CATH URINE Performed at [facility], [address], [place], [place] [address]    Special Requests   Final    NONE Performed at [facility], [address], [place], [place] [address]    Culture MULTIPLE SPECIES PRESENT, SUGGEST RECOLLECTION (A)  Final   Report Status 01/05/2021 FINAL  Final  MRSA PCR Screening     Status: None   Collection Time: 01/03/21  9:00 PM   Specimen: Nasopharyngeal  Result Value Ref Range Status   MRSA by PCR NEGATIVE NEGATIVE Final    Comment:        The GeneXpert MRSA Assay (FDA approved for NASAL specimens only), is one component of a comprehensive MRSA colonization surveillance program. It is not intended to diagnose MRSA infection nor to guide or monitor treatment for MRSA infections. Performed at [facility], [address], [place], [place] [address]      Labs: Basic Metabolic Panel: Recent Labs  Lab  01/04/21 0507 01/04/21 1629 01/05/21 0927 01/06/21 0850 01/07/21 0725  NA 137 141 137 137 138  K 3.3* 4.2 3.2* 3.5 3.5  CL 107 103 95* 100 103  CO2 17* 25 29 27 27   GLUCOSE 83 57* 69* 98 101*  BUN 8 7 <5* <5* 9  CREATININE 0.54 0.49 0.41* 0.51 0.52  CALCIUM 8.7* 9.1 8.8* 9.3 9.2  MG 1.8  --   --   --   --   PHOS 2.3*  --  2.6  --   --    Liver Function Tests: Recent Labs  Lab  01/03/21 1307 01/05/21 0927  AST 75* 49*  ALT 61* 33  ALKPHOS 62 44  BILITOT 1.6* 1.4*  PROT 10.9* 7.0  ALBUMIN 5.5* 3.5   No results for input(s): LIPASE, AMYLASE in the last 168 hours. Recent Labs  Lab 01/03/21 1605  AMMONIA 71*   CBC: Recent Labs  Lab 01/03/21 1307 01/04/21 0507 01/07/21 0725  WBC 12.2* 5.5 4.3  NEUTROABS 11.5*  --  1.9  HGB 17.4* 12.2 11.8*  HCT 53.7* 36.1 34.8*  MCV 104.9* 102.0* 102.1*  PLT 209 144* 232   Cardiac Enzymes: No results for input(s): CKTOTAL, CKMB, CKMBINDEX, TROPONINI in the last 168 hours. BNP: BNP (last 3 results) No results for input(s): BNP in the last 8760 hours.  ProBNP (last 3 results) No results for input(s): PROBNP in the last 8760 hours.  CBG: Recent Labs  Lab 01/06/21 0002 01/06/21 0411 01/06/21 0729 01/06/21 1233 01/06/21 1616  GLUCAP 72 109* 82 75 70       Signed:  01/08/21, MD [facility] 01/07/2021, 5:20 PM

## 2021-01-08 LAB — CULTURE, BLOOD (ROUTINE X 2)
Culture: NO GROWTH
Special Requests: ADEQUATE

## 2021-01-18 ENCOUNTER — Ambulatory Visit (INDEPENDENT_AMBULATORY_CARE_PROVIDER_SITE_OTHER): Payer: Self-pay | Admitting: Pulmonary Disease

## 2021-01-18 ENCOUNTER — Ambulatory Visit (INDEPENDENT_AMBULATORY_CARE_PROVIDER_SITE_OTHER): Payer: Self-pay

## 2021-01-18 ENCOUNTER — Other Ambulatory Visit: Payer: Self-pay

## 2021-01-18 ENCOUNTER — Ambulatory Visit: Payer: Self-pay

## 2021-01-18 ENCOUNTER — Encounter: Payer: Self-pay | Admitting: Pulmonary Disease

## 2021-01-18 VITALS — BP 134/84 | HR 100 | Temp 98.2°F | Ht 61.0 in | Wt 137.0 lb

## 2021-01-18 DIAGNOSIS — J982 Interstitial emphysema: Secondary | ICD-10-CM

## 2021-01-18 NOTE — Patient Instructions (Addendum)
Pneumomediastinum - resolving No further chest imaging indicated Will arrange for pulmonary function test to rule out asthma. If negative no further Pulmonary follow-up needed  Follow-up after PFTs. Please schedule test in 1 months

## 2021-01-18 NOTE — Progress Notes (Signed)
Subjective:   PATIENT ID: Diane Dickson GENDER: female DOB: 1996-10-12, MRN: [id number]   HPI  Chief Complaint  Patient presents with   Follow-up    No complaints.    Reason for Visit: Hospital Follow-up  [name] is a 25 year old female with history of alcohol abuse and recent diagnosis of pneumomediastinum who presents for hospital follow-up. Grandmother present.  She was recently admitted to [facility] under the Pulmonary service for pneumomediastinum. Of note she has a history of alcohol abuse and presented with nausea and multiple episodes of emesis. Work-up for pneumomediastinum etiology was negative for esophageal rupture. Her diet was advanced without issue. She denies history of asthma. Denies shortness of breath, cough or wheezing. Family history with asthma. Denies choking, difficulty swallowing or dysphagia. Denies unexplained fevers, chills. She reports that she is still drinking and talking to her lawyer about rehab. Denies any episodes of nausea or emesis.  Social History: Never smoker  I have personally reviewed patient's past medical/family/social history, allergies, current medications.  Past Medical History:  Diagnosis Date   Alcohol abuse    Pneumomediastinum (HCC)      Family History  Problem Relation Age of Onset   Healthy Mother      Social History   Occupational History   Not on file  Tobacco Use   Smoking status: Never Smoker   Smokeless tobacco: Never Used  Substance and Sexual Activity   Alcohol use: Yes    Comment: daily   Drug use: Never   Sexual activity: Not on file    No Known Allergies   Outpatient Medications Prior to Visit  Medication Sig Dispense Refill   folic acid (FOLVITE) 1 MG tablet Take 1 tablet (1 mg total) by mouth daily. 30 tablet 0   hydrochlorothiazide (MICROZIDE) 12.5 MG capsule Take 1 capsule (12.5 mg total) by mouth daily. 30 capsule 0   Multiple Vitamin (MULTIVITAMIN WITH MINERALS)  TABS tablet Take 1 tablet by mouth daily. 30 tablet 0   thiamine 100 MG tablet Take 1 tablet (100 mg total) by mouth daily. 30 tablet 0   No facility-administered medications prior to visit.    Review of Systems  Constitutional: Negative for chills, diaphoresis, fever, malaise/fatigue and weight loss.  HENT: Negative for congestion, ear pain and sore throat.   Respiratory: Negative for cough, hemoptysis, sputum production, shortness of breath and wheezing.   Cardiovascular: Negative for chest pain, palpitations and leg swelling.  Gastrointestinal: Negative for abdominal pain, heartburn and nausea.  Genitourinary: Negative for frequency.  Musculoskeletal: Negative for joint pain and myalgias.  Skin: Negative for itching and rash.  Neurological: Negative for dizziness, weakness and headaches.  Endo/Heme/Allergies: Does not bruise/bleed easily.  Psychiatric/Behavioral: Negative for depression. The patient is not nervous/anxious.      Objective:   Vitals:   01/18/21 1238  BP: 134/84  Pulse: 100  Temp: 98.2 F (36.8 C)  SpO2: 98%  Weight: 137 lb (62.1 kg)  Height: 5\' 1"  (1.549 m)   SpO2: 98 % O2 Device: None (Room air)  Physical Exam: General: Well-appearing, no acute distress HENT: [place], AT Eyes: EOMI, no scleral icterus Respiratory: Clear to auscultation bilaterally.  No crackles, wheezing or rales Cardiovascular: RRR, -M/R/G, no JVD Extremities:-Edema,-tenderness Neuro: AAO x4, CNII-XII grossly intact Skin: Intact, no rashes or bruising Psych: Normal mood, normal affect  Data Reviewed:  Imaging: CT Chest 01/03/21 - Large pneumomediastinum and pneumopericardium. No mediastinal fluid collection CXR 01/07/21 - Persistent pneumomediastinum  and subcutaneous emphysema CXR 01/18/21 - Minimal residual pneumomediastinum  PFT: None on file  Labs: CBC    Component Value Date/Time   WBC 4.3 01/07/2021 0725   RBC 3.41 (L) 01/07/2021 0725   HGB 11.8 (L) 01/07/2021 0725    HCT 34.8 (L) 01/07/2021 0725   PLT 232 01/07/2021 0725   MCV 102.1 (H) 01/07/2021 0725   MCH 34.6 (H) 01/07/2021 0725   MCHC 33.9 01/07/2021 0725   RDW 12.6 01/07/2021 0725   LYMPHSABS 2.0 01/07/2021 0725   MONOABS 0.4 01/07/2021 0725   EOSABS 0.1 01/07/2021 0725   BASOSABS 0.0 01/07/2021 0725   Absolute Eos 01/07/21 - 100    Assessment & Plan:   Discussion: 25 year old female with alcohol abuse who presents for follow-up for pneumomediastinum. In setting of nausea and emesis, esophageal rupture was ruled out. Suspect alveolar rupture from significant dry heaving during emesis. No suspicion for tracheobronchial injury, esophageal perforation, head/neck surgery and abdominal injury. She also does not have any known history of asthma or obstructive lung disease to predispose her to this injury.  Pneumomediastinum - resolving No further chest imaging indicated Will arrange for pulmonary function test to rule out asthma. If negative no further Pulmonary follow-up needed  Follow-up after PFTs. Please schedule test in 1 months  Health Maintenance  There is no immunization history on file for this patient.   CT Lung Screen - not indicated  Orders Placed This Encounter  Procedures   Pulmonary function test    Standing Status:   Future    Standing Expiration Date:   01/18/2022    Scheduling Instructions:     Complete in 1 months    Order Specific Question:   Where should this test be performed?    Answer:   [facility]    Order Specific Question:   Full PFT: includes the following: basic spirometry, spirometry pre & post bronchodilator, diffusion capacity (DLCO), lung volumes    Answer:   Full PFT  No orders of the defined types were placed in this encounter.   Return in about 1 month (around 02/18/2021).  I have spent a total time of 31-minutes on the day of the appointment reviewing prior documentation, coordinating care and discussing medical diagnosis and plan with the  patient/family. Imaging, labs and tests included in this note have been reviewed and interpreted independently by me.  Cherokee Boccio Mechele Collin, MD [facility] 01/18/2021 12:42 PM  Office Number [phone]

## 2021-01-19 DIAGNOSIS — I10 Essential (primary) hypertension: Secondary | ICD-10-CM | POA: Insufficient documentation

## 2021-01-19 DIAGNOSIS — F1010 Alcohol abuse, uncomplicated: Secondary | ICD-10-CM | POA: Insufficient documentation

## 2021-03-04 ENCOUNTER — Other Ambulatory Visit (HOSPITAL_COMMUNITY): Payer: Self-pay

## 2021-03-08 ENCOUNTER — Ambulatory Visit: Payer: Self-pay | Admitting: Pulmonary Disease

## 2021-04-29 ENCOUNTER — Encounter (HOSPITAL_COMMUNITY): Payer: Self-pay

## 2021-04-29 ENCOUNTER — Emergency Department (HOSPITAL_COMMUNITY): Payer: Self-pay

## 2021-04-29 ENCOUNTER — Other Ambulatory Visit: Payer: Self-pay

## 2021-04-29 ENCOUNTER — Emergency Department (HOSPITAL_COMMUNITY)
Admission: EM | Admit: 2021-04-29 | Discharge: 2021-04-29 | Disposition: A | Discharge status: Home / Self Care | Payer: Self-pay | Attending: Emergency Medicine | Admitting: Emergency Medicine

## 2021-04-29 DIAGNOSIS — I10 Essential (primary) hypertension: Secondary | ICD-10-CM | POA: Insufficient documentation

## 2021-04-29 DIAGNOSIS — F10920 Alcohol use, unspecified with intoxication, uncomplicated: Secondary | ICD-10-CM

## 2021-04-29 DIAGNOSIS — Y906 Blood alcohol level of 120-199 mg/100 ml: Secondary | ICD-10-CM | POA: Insufficient documentation

## 2021-04-29 DIAGNOSIS — R112 Nausea with vomiting, unspecified: Secondary | ICD-10-CM

## 2021-04-29 DIAGNOSIS — F10129 Alcohol abuse with intoxication, unspecified: Secondary | ICD-10-CM | POA: Insufficient documentation

## 2021-04-29 DIAGNOSIS — R7989 Other specified abnormal findings of blood chemistry: Secondary | ICD-10-CM

## 2021-04-29 DIAGNOSIS — Z79899 Other long term (current) drug therapy: Secondary | ICD-10-CM | POA: Insufficient documentation

## 2021-04-29 DIAGNOSIS — R945 Abnormal results of liver function studies: Secondary | ICD-10-CM | POA: Insufficient documentation

## 2021-04-29 LAB — COMPREHENSIVE METABOLIC PANEL
ALT: 83 U/L — ABNORMAL HIGH (ref 0–44)
AST: 275 U/L — ABNORMAL HIGH (ref 15–41)
Albumin: 4.4 g/dL (ref 3.5–5.0)
Alkaline Phosphatase: 63 U/L (ref 38–126)
Anion gap: 20 — ABNORMAL HIGH (ref 5–15)
BUN: 15 mg/dL (ref 6–20)
CO2: 24 mmol/L (ref 22–32)
Calcium: 9.3 mg/dL (ref 8.9–10.3)
Chloride: 99 mmol/L (ref 98–111)
Creatinine, Ser: 0.83 mg/dL (ref 0.44–1.00)
GFR, Estimated: >60 mL/min (ref >60)
Glucose, Bld: 101 mg/dL — ABNORMAL HIGH (ref 70–99)
Potassium: 3.4 mmol/L — ABNORMAL LOW (ref 3.5–5.1)
Sodium: 143 mmol/L (ref 135–145)
Total Bilirubin: 0.9 mg/dL (ref 0.3–1.2)
Total Protein: 8.5 g/dL — ABNORMAL HIGH (ref 6.5–8.1)

## 2021-04-29 LAB — URINALYSIS, ROUTINE W REFLEX MICROSCOPIC
Glucose, UA: NEGATIVE mg/dL
Hgb urine dipstick: NEGATIVE
Ketones, ur: 40 mg/dL — AB
Leukocytes,Ua: NEGATIVE
Nitrite: NEGATIVE
Protein, ur: 100 mg/dL — AB
Specific Gravity, Urine: 1.03 (ref 1.005–1.030)
pH: 6.5 (ref 5.0–8.0)

## 2021-04-29 LAB — CBC WITH DIFFERENTIAL/PLATELET
Abs Immature Granulocytes: 0.02 10*3/uL (ref 0.00–0.07)
Basophils Absolute: 0 10*3/uL (ref 0.0–0.1)
Basophils Relative: 0 %
Eosinophils Absolute: 0 10*3/uL (ref 0.0–0.5)
Eosinophils Relative: 0 %
HCT: 43.5 % (ref 36.0–46.0)
Hemoglobin: 14.9 g/dL (ref 12.0–15.0)
Immature Granulocytes: 0 %
Lymphocytes Relative: 6 %
Lymphs Abs: 0.3 10*3/uL — ABNORMAL LOW (ref 0.7–4.0)
MCH: 34.8 pg — ABNORMAL HIGH (ref 26.0–34.0)
MCHC: 34.3 g/dL (ref 30.0–36.0)
MCV: 101.6 fL — ABNORMAL HIGH (ref 80.0–100.0)
Monocytes Absolute: 0.1 10*3/uL (ref 0.1–1.0)
Monocytes Relative: 2 %
Neutro Abs: 4.7 10*3/uL (ref 1.7–7.7)
Neutrophils Relative %: 92 %
Platelets: 213 10*3/uL (ref 150–400)
RBC: 4.28 MIL/uL (ref 3.87–5.11)
RDW: 13.2 % (ref 11.5–15.5)
WBC: 5.2 10*3/uL (ref 4.0–10.5)
nRBC: 0 % (ref 0.0–0.2)

## 2021-04-29 LAB — LIPASE, BLOOD: Lipase: 18 U/L (ref 11–51)

## 2021-04-29 LAB — I-STAT BETA HCG BLOOD, ED (MC, WL, AP ONLY): I-stat hCG, quantitative: <5 m[IU]/mL (ref <5)

## 2021-04-29 LAB — ETHANOL: Alcohol, Ethyl (B): 180 mg/dL — ABNORMAL HIGH (ref <10)

## 2021-04-29 MED ORDER — ONDANSETRON 4 MG PO TBDP: 4.0000 mg | ORAL_TABLET | Freq: Three times a day (TID) | ORAL | 0 refills | Status: DC | PRN

## 2021-04-29 MED ORDER — SODIUM CHLORIDE 0.9 % IV BOLUS
1000.0000 mL | Freq: Once | INTRAVENOUS | Status: AC
  Administered 2021-04-29: 04:00:00 1000 mL via INTRAVENOUS

## 2021-04-29 MED ORDER — SODIUM CHLORIDE 0.9 % IV BOLUS
500.0000 mL | Freq: Once | INTRAVENOUS | Status: AC
  Administered 2021-04-29: 500 mL via INTRAVENOUS

## 2021-04-29 MED ORDER — LORAZEPAM 1 MG PO TABS
0.0000 mg | ORAL_TABLET | Freq: Four times a day (QID) | ORAL | Status: DC
Start: 2021-04-29 — End: 2021-04-29

## 2021-04-29 MED ORDER — LORAZEPAM 2 MG/ML IJ SOLN: 0.0000 mg | Freq: Two times a day (BID) | INTRAMUSCULAR | Status: DC

## 2021-04-29 MED ORDER — THIAMINE HCL 100 MG PO TABS: 100.0000 mg | ORAL_TABLET | Freq: Every day | ORAL | Status: DC

## 2021-04-29 MED ORDER — METOCLOPRAMIDE HCL 5 MG/ML IJ SOLN
10.0000 mg | Freq: Once | INTRAMUSCULAR | Status: AC
  Administered 2021-04-29: 10 mg via INTRAVENOUS
  Filled 2021-04-29: qty 2

## 2021-04-29 MED ORDER — LORAZEPAM 1 MG PO TABS: 0.0000 mg | ORAL_TABLET | Freq: Two times a day (BID) | ORAL | Status: DC

## 2021-04-29 MED ORDER — LORAZEPAM 2 MG/ML IJ SOLN
0.0000 mg | Freq: Four times a day (QID) | INTRAMUSCULAR | Status: DC
  Administered 2021-04-29: 1 mg via INTRAVENOUS
  Filled 2021-04-29: qty 1

## 2021-04-29 MED ORDER — PANTOPRAZOLE SODIUM 40 MG IV SOLR
40.0000 mg | Freq: Once | INTRAVENOUS | Status: AC
  Administered 2021-04-29: 40 mg via INTRAVENOUS
  Filled 2021-04-29: qty 40

## 2021-04-29 MED ORDER — ONDANSETRON HCL 4 MG/2ML IJ SOLN
4.0000 mg | Freq: Once | INTRAMUSCULAR | Status: AC
Start: 2021-04-29 — End: 2021-04-29
  Administered 2021-04-29: 04:00:00 4 mg via INTRAVENOUS
  Filled 2021-04-29: qty 2

## 2021-04-29 MED ORDER — THIAMINE HCL 100 MG/ML IJ SOLN: 100.0000 mg | Freq: Every day | INTRAMUSCULAR | Status: DC

## 2021-04-29 NOTE — ED Triage Notes (Signed)
Pt to ED from home with c/o NV & abdominal pain which began yesterday. Arrives A+O, VSS, NADN.

## 2021-04-29 NOTE — Discharge Instructions (Addendum)
Take Zofran for any recurrent nausea.   Your liver tests are elevated which is a result of alcohol abuse. Follow up with your primary care doctor to have these rechecked in the next 1 month.

## 2021-04-29 NOTE — ED Provider Notes (Signed)
Care assumed from [facility], PA-C at shift change pending improvement in heart rate after IV fluids.  See her note for full HPI.  In short, patient is a 25 year old female with a history of alcoholism and hypertension presents to the ED due to persistent nonbloody, nonbilious emesis for the past 2 days.  Plan from previous provider: Patient can be discharged home with zofran if able to tolerate po and HR improves to low 100s.   ED Course/Procedures    Results for orders placed or performed during the hospital encounter of 04/29/21 (from the past 24 hour(s))  CBC with Differential     Status: Abnormal   Collection Time: 04/29/21  3:44 AM  Result Value Ref Range   WBC 5.2 4.0 - 10.5 K/uL   RBC 4.28 3.87 - 5.11 MIL/uL   Hemoglobin 14.9 12.0 - 15.0 g/dL   HCT 70.2 63.7 - 85.8 %   MCV 101.6 (H) 80.0 - 100.0 fL   MCH 34.8 (H) 26.0 - 34.0 pg   MCHC 34.3 30.0 - 36.0 g/dL   RDW 85.0 27.7 - 41.2 %   Platelets 213 150 - 400 K/uL   nRBC 0.0 0.0 - 0.2 %   Neutrophils Relative % 92 %   Neutro Abs 4.7 1.7 - 7.7 K/uL   Lymphocytes Relative 6 %   Lymphs Abs 0.3 (L) 0.7 - 4.0 K/uL   Monocytes Relative 2 %   Monocytes Absolute 0.1 0.1 - 1.0 K/uL   Eosinophils Relative 0 %   Eosinophils Absolute 0.0 0.0 - 0.5 K/uL   Basophils Relative 0 %   Basophils Absolute 0.0 0.0 - 0.1 K/uL   Immature Granulocytes 0 %   Abs Immature Granulocytes 0.02 0.00 - 0.07 K/uL  Comprehensive metabolic panel     Status: Abnormal   Collection Time: 04/29/21  3:44 AM  Result Value Ref Range   Sodium 143 135 - 145 mmol/L   Potassium 3.4 (L) 3.5 - 5.1 mmol/L   Chloride 99 98 - 111 mmol/L   CO2 24 22 - 32 mmol/L   Glucose, Bld 101 (H) 70 - 99 mg/dL   BUN 15 6 - 20 mg/dL   Creatinine, Ser 8.78 0.44 - 1.00 mg/dL   Calcium 9.3 8.9 - 67.6 mg/dL   Total Protein 8.5 (H) 6.5 - 8.1 g/dL   Albumin 4.4 3.5 - 5.0 g/dL   AST 720 (H) 15 - 41 U/L   ALT 83 (H) 0 - 44 U/L   Alkaline Phosphatase 63 38 - 126 U/L   Total  Bilirubin 0.9 0.3 - 1.2 mg/dL   GFR, Estimated >94 >70 mL/min   Anion gap 20 (H) 5 - 15  Ethanol     Status: Abnormal   Collection Time: 04/29/21  3:44 AM  Result Value Ref Range   Alcohol, Ethyl (B) 180 (H) <10 mg/dL  Lipase, blood     Status: None   Collection Time: 04/29/21  3:44 AM  Result Value Ref Range   Lipase 18 11 - 51 U/L  I-Stat beta hCG blood, ED     Status: None   Collection Time: 04/29/21  3:54 AM  Result Value Ref Range   I-stat hCG, quantitative <5.0 <5 mIU/mL   Comment 3          Urinalysis, Routine w reflex microscopic Urine, Clean Catch     Status: Abnormal   Collection Time: 04/29/21  6:48 AM  Result Value Ref Range   Color, Urine YELLOW (A)  YELLOW   APPearance HAZY (A) CLEAR   Specific Gravity, Urine 1.030 1.005 - 1.030   pH 6.5 5.0 - 8.0   Glucose, UA NEGATIVE NEGATIVE mg/dL   Hgb urine dipstick NEGATIVE NEGATIVE   Bilirubin Urine SMALL (A) NEGATIVE   Ketones, ur 40 (A) NEGATIVE mg/dL   Protein, ur 409 (A) NEGATIVE mg/dL   Nitrite NEGATIVE NEGATIVE   Leukocytes,Ua NEGATIVE NEGATIVE   RBC / HPF 0-5 0 - 5 RBC/hpf   WBC, UA 0-5 0 - 5 WBC/hpf   Bacteria, UA RARE (A) NONE SEEN   Squamous Epithelial / LPF 0-5 0 - 5   Mucus PRESENT     Procedures  MDM  Care assumed from [employer], PA-C at shift change pending improvement in heart rate after IV fluids.  See her note for full MDM  25 year old female presents to the ED due to numerous episodes of nonbloody, nonbilious emesis x2 days.  History of alcohol abuse.  Upon arrival, patient tachycardic at 107 with otherwise reassuring vitals.  Patient nontoxic-appearing.  I have reviewed all labs from previous provider.  CBC reassuring with no leukocytosis and normal hemoglobin.  Ethanol level elevated at 180.  CMP significant for elevated AST at 275 and ALT at 83.  Mild hypokalemia at 3.4.  Anion gap of 20 likely due to intoxication.  A significant for ketonuria and proteinuria likely due to dehydration. Rare  bacteria.  Pregnancy test negative.  Lipase normal.  Low suspicion for pancreatitis.  8:25 AM Patient able to tolerate po at bedside. HR improved after another 1L IVFs.  Through chart review, it appears patient's baseline is around 100. EKG with sinus tachycardia with no signs of acute ischemia. CXR without signs of pneumomediastinum. Patient clinically sober. Low suspicion for alcohol withdrawal at this time. No tremor or hallucinations. Patient able to ambulate in the ED without difficulty. Advised patient to follow-up with PCP to recheck LFTs given elevation which is likely due to alcohol consumption. Patient states she wants to continue drinking, not a candidate for librium taper. I had a long discussion with patient about the harmful side effects of alcohol consumption. Patient stable for discharge. Strict ED precautions discussed with patient. Patient states understanding and agrees to plan. Patient discharged home in no acute distress and stable vitals.  Previous provider discussed case with attending, Dr. Nicanor Alcon who agreed with assessment and plan.          Jesusita Oka 04/29/21 8119    Palumbo, April, MD 04/30/21 [phone]

## 2021-04-29 NOTE — ED Provider Notes (Signed)
[facility]-EMERGENCY DEPT Provider Note   CSN: [id number] Arrival date & time: 04/29/21  0250     History No chief complaint on file.   Diane Dickson is a 25 y.o. female.  Patient with history of alcoholism, HTN presents with 1-2 days of persistent vomiting. She reports she has been unable to hold anything down when she attempts PO. No fever. No hematemesis or diarrhea. She reports pain in the lower abdomen bilaterally. She felt as though she was starting her period yesterday but now the bleeding has stopped which is not like a usual menses. No vaginal discharge. She does not know whether or not she could be pregnant. No chest pain or SOB.   The history is provided by the patient. No language interpreter was used.      Past Medical History:  Diagnosis Date   Alcohol abuse    Pneumomediastinum [facility])     Patient Active Problem List   Diagnosis Date Noted   Alcohol abuse 01/19/2021   Hypertension 01/19/2021   Pneumomediastinum (HCC) 01/03/2021    No past surgical history on file.   OB History   No obstetric history on file.     Family History  Problem Relation Age of Onset   Healthy Mother     Social History   Tobacco Use   Smoking status: Never   Smokeless tobacco: Never  Substance Use Topics   Alcohol use: Yes    Comment: daily   Drug use: Never    Home Medications Prior to Admission medications   Medication Sig Start Date End Date Taking? Authorizing Provider  hydrochlorothiazide (MICROZIDE) 12.5 MG capsule Take 1 capsule (12.5 mg total) by mouth daily. 01/08/21 02/07/21  Briant Cedar, MD    Allergies    Patient has no known allergies.  Review of Systems   Review of Systems  Constitutional:  Negative for chills and fever.  Respiratory: Negative.  Negative for shortness of breath.   Cardiovascular: Negative.  Negative for chest pain.  Gastrointestinal:  Positive for abdominal pain, nausea and vomiting. Negative for  blood in stool and diarrhea.  Genitourinary:  Positive for menstrual problem. Negative for dysuria, flank pain and vaginal discharge.  Musculoskeletal: Negative.  Negative for myalgias.  Skin: Negative.   Neurological: Negative.  Negative for weakness.   Physical Exam Updated Vital Signs BP (!) 155/102 (BP Location: Left Arm)   Pulse (!) 107   Temp 98.5 F (36.9 C)   Resp 16   SpO2 100%   Physical Exam Vitals and nursing note reviewed.  Constitutional:      General: She is not in acute distress.    Comments: Patient has a strong smell of alcohol.  HENT:     Head: Normocephalic and atraumatic.  Eyes:     Conjunctiva/sclera: Conjunctivae normal.  Cardiovascular:     Rate and Rhythm: Normal rate and regular rhythm.     Heart sounds: No murmur heard. Pulmonary:     Effort: Pulmonary effort is normal.     Breath sounds: No wheezing, rhonchi or rales.  Abdominal:     General: There is no distension.     Palpations: Abdomen is soft.     Tenderness: There is abdominal tenderness (Across lower abdomen).  Musculoskeletal:        General: Normal range of motion.     Cervical back: Normal range of motion and neck supple.  Skin:    General: Skin is warm and dry.  Neurological:  Mental Status: She is alert and oriented to person, place, and time.    ED Results / Procedures / Treatments   Labs (all labs ordered are listed, but only abnormal results are displayed) Labs Reviewed  CBC WITH DIFFERENTIAL/PLATELET  COMPREHENSIVE METABOLIC PANEL  ETHANOL  LIPASE, BLOOD  URINALYSIS, ROUTINE W REFLEX MICROSCOPIC  I-STAT BETA HCG BLOOD, ED ([facility], [facility], [facility] ONLY)    EKG None  Radiology No results found.  Procedures Procedures   Medications Ordered in ED Medications  sodium chloride 0.9 % bolus 1,000 mL (has no administration in time range)  ondansetron (ZOFRAN) injection 4 mg (has no administration in time range)    ED Course  I have reviewed the triage vital signs and the  nursing notes.  Pertinent labs & imaging results that were available during my care of the patient were reviewed by me and considered in my medical decision making (see chart for details).    MDM Rules/Calculators/A&P                          Patient to ED with nausea and vomiting for the past one day. No fever. No diarrhea.   She is acutely intoxicated but coherent, steady gait, oriented. Tachycardic. IV established for bolus fluids, medications for symptoms. Labs pending.   Pregnancy negative. AST is elevated compared to last 3 months ago. Normal total bilirubin. No vomiting in ED. PO challenge provided.   On recheck, she reports recurrent vomiting with PO challenge. Now her chest is burning with pain to her back. H/O pneumomediastinum in February with appropriate outpatient follow up with pulmonology. No esophageal rupture on work up and it was felt secondary to forceful retching. Will obtain CXR to insure to recurrence. IV Protonix and REglan ordered for further symptomatic relief. Will have CIWA protocol in place in the event she develops symptoms of withdrawal given long history alcohol dependence.   6:30 - patient ambulatory to the bathroom without vomiting. CXR negative for recurrent pneumomediastinum. On recheck, she is sitting up in bed, awake, alert. She states she feels much better, is drinking ginger ale, no further vomiting. Heart rate is still mildly tachycardic. Will give additional 500 cc fluids and she can be discharged home.     Final Clinical Impression(s) / ED Diagnoses Final diagnoses:  None  Nausea and vomiting Alcohol intoxication Abnormal liver function  Rx / DC Orders ED Discharge Orders     None        Danne Harbor 04/29/21 2376    Palumbo, April, MD 04/30/21 [phone]

## 2021-12-09 ENCOUNTER — Emergency Department (HOSPITAL_COMMUNITY)
Admission: EM | Admit: 2021-12-09 | Discharge: 2021-12-09 | Disposition: A | Discharge status: Home / Self Care | Payer: Self-pay | Attending: Emergency Medicine | Admitting: Emergency Medicine

## 2021-12-09 ENCOUNTER — Other Ambulatory Visit: Payer: Self-pay

## 2021-12-09 ENCOUNTER — Encounter (HOSPITAL_COMMUNITY): Payer: Self-pay

## 2021-12-09 DIAGNOSIS — F10129 Alcohol abuse with intoxication, unspecified: Secondary | ICD-10-CM | POA: Insufficient documentation

## 2021-12-09 DIAGNOSIS — Y908 Blood alcohol level of 240 mg/100 ml or more: Secondary | ICD-10-CM | POA: Insufficient documentation

## 2021-12-09 DIAGNOSIS — F1010 Alcohol abuse, uncomplicated: Secondary | ICD-10-CM

## 2021-12-09 DIAGNOSIS — Z79899 Other long term (current) drug therapy: Secondary | ICD-10-CM | POA: Insufficient documentation

## 2021-12-09 HISTORY — DX: Bipolar disorder, unspecified: F31.9

## 2021-12-09 HISTORY — DX: Post-traumatic stress disorder, unspecified: F43.10

## 2021-12-09 LAB — URINALYSIS, ROUTINE W REFLEX MICROSCOPIC
Bilirubin Urine: NEGATIVE
Glucose, UA: NEGATIVE mg/dL
Hgb urine dipstick: NEGATIVE
Ketones, ur: 20 mg/dL — AB
Nitrite: NEGATIVE
Protein, ur: 30 mg/dL — AB
Specific Gravity, Urine: 1.02 (ref 1.005–1.030)
pH: 7 (ref 5.0–8.0)

## 2021-12-09 LAB — COMPREHENSIVE METABOLIC PANEL
ALT: 41 U/L (ref 0–44)
AST: 101 U/L — ABNORMAL HIGH (ref 15–41)
Albumin: 3.8 g/dL (ref 3.5–5.0)
Alkaline Phosphatase: 57 U/L (ref 38–126)
Anion gap: 11 (ref 5–15)
BUN: 13 mg/dL (ref 6–20)
CO2: 26 mmol/L (ref 22–32)
Calcium: 8.7 mg/dL — ABNORMAL LOW (ref 8.9–10.3)
Chloride: 107 mmol/L (ref 98–111)
Creatinine, Ser: 0.49 mg/dL (ref 0.44–1.00)
GFR, Estimated: >60 mL/min (ref >60)
Glucose, Bld: 72 mg/dL (ref 70–99)
Potassium: 3.8 mmol/L (ref 3.5–5.1)
Sodium: 144 mmol/L (ref 135–145)
Total Bilirubin: 0.6 mg/dL (ref 0.3–1.2)
Total Protein: 7.5 g/dL (ref 6.5–8.1)

## 2021-12-09 LAB — CBC WITH DIFFERENTIAL/PLATELET
Abs Immature Granulocytes: 0 10*3/uL (ref 0.00–0.07)
Basophils Absolute: 0 10*3/uL (ref 0.0–0.1)
Basophils Relative: 1 %
Eosinophils Absolute: 0 10*3/uL (ref 0.0–0.5)
Eosinophils Relative: 0 %
HCT: 43.3 % (ref 36.0–46.0)
Hemoglobin: 14.3 g/dL (ref 12.0–15.0)
Immature Granulocytes: 0 %
Lymphocytes Relative: 51 %
Lymphs Abs: 1.6 10*3/uL (ref 0.7–4.0)
MCH: 33.8 pg (ref 26.0–34.0)
MCHC: 33 g/dL (ref 30.0–36.0)
MCV: 102.4 fL — ABNORMAL HIGH (ref 80.0–100.0)
Monocytes Absolute: 0.2 10*3/uL (ref 0.1–1.0)
Monocytes Relative: 6 %
Neutro Abs: 1.3 10*3/uL — ABNORMAL LOW (ref 1.7–7.7)
Neutrophils Relative %: 42 %
Platelets: 263 10*3/uL (ref 150–400)
RBC: 4.23 MIL/uL (ref 3.87–5.11)
RDW: 14.5 % (ref 11.5–15.5)
WBC: 3.1 10*3/uL — ABNORMAL LOW (ref 4.0–10.5)
nRBC: 0 % (ref 0.0–0.2)

## 2021-12-09 LAB — LIPASE, BLOOD: Lipase: 31 U/L (ref 11–51)

## 2021-12-09 LAB — I-STAT BETA HCG BLOOD, ED (MC, WL, AP ONLY): I-stat hCG, quantitative: <5 m[IU]/mL (ref <5)

## 2021-12-09 LAB — ETHANOL: Alcohol, Ethyl (B): 430 mg/dL (ref <10)

## 2021-12-09 MED ORDER — LACTATED RINGERS IV BOLUS
1000.0000 mL | Freq: Once | INTRAVENOUS | Status: AC
  Administered 2021-12-09: 1000 mL via INTRAVENOUS

## 2021-12-09 MED ORDER — CHLORDIAZEPOXIDE HCL 25 MG PO CAPS: ORAL_CAPSULE | ORAL | 0 refills | Status: DC

## 2021-12-09 MED ORDER — THIAMINE HCL 100 MG PO TABS: 100.0000 mg | ORAL_TABLET | Freq: Every day | ORAL | 0 refills | Status: DC
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
## 2021-12-09 NOTE — Discharge Instructions (Addendum)
You are being prescribed Librium to help prevent withdrawals.  Do not use this with alcohol.  It is important to follow-up with a detox/rehabilitation center as alcohol withdrawal can be life-threatening if done by her self.
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
## 2021-12-22 ENCOUNTER — Other Ambulatory Visit (HOSPITAL_COMMUNITY)
Admission: EM | Admit: 2021-12-22 | Discharge: 2021-12-26 | Disposition: A | Discharge status: Rehabilitation Facility | Payer: No Payment, Other | Attending: Psychiatry | Admitting: Psychiatry

## 2021-12-22 DIAGNOSIS — F419 Anxiety disorder, unspecified: Secondary | ICD-10-CM | POA: Insufficient documentation

## 2021-12-22 DIAGNOSIS — W458XXA Other foreign body or object entering through skin, initial encounter: Secondary | ICD-10-CM | POA: Insufficient documentation

## 2021-12-22 DIAGNOSIS — I10 Essential (primary) hypertension: Secondary | ICD-10-CM | POA: Insufficient documentation

## 2021-12-22 DIAGNOSIS — Z599 Problem related to housing and economic circumstances, unspecified: Secondary | ICD-10-CM | POA: Insufficient documentation

## 2021-12-22 DIAGNOSIS — Z789 Other specified health status: Secondary | ICD-10-CM | POA: Diagnosis present

## 2021-12-22 DIAGNOSIS — Z560 Unemployment, unspecified: Secondary | ICD-10-CM | POA: Insufficient documentation

## 2021-12-22 DIAGNOSIS — Z79899 Other long term (current) drug therapy: Secondary | ICD-10-CM | POA: Insufficient documentation

## 2021-12-22 DIAGNOSIS — R450 Nervousness: Secondary | ICD-10-CM | POA: Insufficient documentation

## 2021-12-22 DIAGNOSIS — R4587 Impulsiveness: Secondary | ICD-10-CM | POA: Insufficient documentation

## 2021-12-22 DIAGNOSIS — S51812A Laceration without foreign body of left forearm, initial encounter: Secondary | ICD-10-CM | POA: Insufficient documentation

## 2021-12-22 DIAGNOSIS — Z9152 Personal history of nonsuicidal self-harm: Secondary | ICD-10-CM | POA: Insufficient documentation

## 2021-12-22 DIAGNOSIS — R4588 Nonsuicidal self-harm: Secondary | ICD-10-CM | POA: Insufficient documentation

## 2021-12-22 DIAGNOSIS — E876 Hypokalemia: Secondary | ICD-10-CM | POA: Insufficient documentation

## 2021-12-22 DIAGNOSIS — F1290 Cannabis use, unspecified, uncomplicated: Secondary | ICD-10-CM | POA: Insufficient documentation

## 2021-12-22 DIAGNOSIS — F1010 Alcohol abuse, uncomplicated: Secondary | ICD-10-CM | POA: Insufficient documentation

## 2021-12-22 DIAGNOSIS — F32A Depression, unspecified: Secondary | ICD-10-CM | POA: Insufficient documentation

## 2021-12-22 DIAGNOSIS — F1994 Other psychoactive substance use, unspecified with psychoactive substance-induced mood disorder: Secondary | ICD-10-CM | POA: Insufficient documentation

## 2021-12-22 DIAGNOSIS — Z20822 Contact with and (suspected) exposure to covid-19: Secondary | ICD-10-CM | POA: Insufficient documentation

## 2021-12-22 LAB — CBC WITH DIFFERENTIAL/PLATELET
Abs Immature Granulocytes: 0.01 10*3/uL (ref 0.00–0.07)
Basophils Absolute: 0 10*3/uL (ref 0.0–0.1)
Basophils Relative: 1 %
Eosinophils Absolute: 0 10*3/uL (ref 0.0–0.5)
Eosinophils Relative: 1 %
HCT: 40.2 % (ref 36.0–46.0)
Hemoglobin: 13.4 g/dL (ref 12.0–15.0)
Immature Granulocytes: 0 %
Lymphocytes Relative: 29 %
Lymphs Abs: 1.3 10*3/uL (ref 0.7–4.0)
MCH: 34 pg (ref 26.0–34.0)
MCHC: 33.3 g/dL (ref 30.0–36.0)
MCV: 102 fL — ABNORMAL HIGH (ref 80.0–100.0)
Monocytes Absolute: 0.3 10*3/uL (ref 0.1–1.0)
Monocytes Relative: 8 %
Neutro Abs: 2.7 10*3/uL (ref 1.7–7.7)
Neutrophils Relative %: 61 %
Platelets: 304 10*3/uL (ref 150–400)
RBC: 3.94 MIL/uL (ref 3.87–5.11)
RDW: 13.3 % (ref 11.5–15.5)
WBC: 4.4 10*3/uL (ref 4.0–10.5)
nRBC: 0 % (ref 0.0–0.2)

## 2021-12-22 LAB — POCT URINE DRUG SCREEN - MANUAL ENTRY (I-SCREEN)
POC Amphetamine UR: NOT DETECTED
POC Buprenorphine (BUP): NOT DETECTED
POC Cocaine UR: NOT DETECTED
POC Marijuana UR: POSITIVE — AB
POC Methadone UR: NOT DETECTED
POC Methamphetamine UR: NOT DETECTED
POC Morphine: NOT DETECTED
POC Oxazepam (BZO): NOT DETECTED
POC Oxycodone UR: NOT DETECTED
POC Secobarbital (BAR): NOT DETECTED

## 2021-12-22 LAB — COMPREHENSIVE METABOLIC PANEL
ALT: 35 U/L (ref 0–44)
AST: 46 U/L — ABNORMAL HIGH (ref 15–41)
Albumin: 3.8 g/dL (ref 3.5–5.0)
Alkaline Phosphatase: 71 U/L (ref 38–126)
Anion gap: 13 (ref 5–15)
BUN: 5 mg/dL — ABNORMAL LOW (ref 6–20)
CO2: 27 mmol/L (ref 22–32)
Calcium: 9.2 mg/dL (ref 8.9–10.3)
Chloride: 97 mmol/L — ABNORMAL LOW (ref 98–111)
Creatinine, Ser: 0.63 mg/dL (ref 0.44–1.00)
GFR, Estimated: >60 mL/min (ref >60)
Glucose, Bld: 78 mg/dL (ref 70–99)
Potassium: 3.1 mmol/L — ABNORMAL LOW (ref 3.5–5.1)
Sodium: 137 mmol/L (ref 135–145)
Total Bilirubin: 0.6 mg/dL (ref 0.3–1.2)
Total Protein: 7.3 g/dL (ref 6.5–8.1)

## 2021-12-22 LAB — LIPID PANEL
Cholesterol: 253 mg/dL — ABNORMAL HIGH (ref 0–200)
HDL: 132 mg/dL (ref >40)
LDL Cholesterol: 103 mg/dL — ABNORMAL HIGH (ref 0–99)
Total CHOL/HDL Ratio: 1.9 RATIO
Triglycerides: 91 mg/dL (ref <150)
VLDL: 18 mg/dL (ref 0–40)

## 2021-12-22 LAB — RESP PANEL BY RT-PCR (FLU A&B, COVID) ARPGX2
Influenza A by PCR: NEGATIVE
Influenza B by PCR: NEGATIVE
SARS Coronavirus 2 by RT PCR: NEGATIVE

## 2021-12-22 LAB — POCT PREGNANCY, URINE: Preg Test, Ur: NEGATIVE

## 2021-12-22 LAB — HEMOGLOBIN A1C
Hgb A1c MFr Bld: 4.9 % (ref 4.8–5.6)
Mean Plasma Glucose: 93.93 mg/dL

## 2021-12-22 LAB — ETHANOL: Alcohol, Ethyl (B): <10 mg/dL (ref <10)

## 2021-12-22 LAB — TSH: TSH: 1.816 u[IU]/mL (ref 0.350–4.500)

## 2021-12-22 LAB — POC SARS CORONAVIRUS 2 AG -  ED: SARS Coronavirus 2 Ag: NEGATIVE

## 2021-12-22 MED ORDER — ACETAMINOPHEN 325 MG PO TABS
650.0000 mg | ORAL_TABLET | Freq: Four times a day (QID) | ORAL | Status: DC | PRN
  Administered 2021-12-22: 650 mg via ORAL
  Filled 2021-12-22: qty 2

## 2021-12-22 MED ORDER — LORAZEPAM 1 MG PO TABS: 1.0000 mg | ORAL_TABLET | Freq: Every day | ORAL | Status: DC

## 2021-12-22 MED ORDER — HYDROXYZINE HCL 25 MG PO TABS
25.0000 mg | ORAL_TABLET | Freq: Three times a day (TID) | ORAL | Status: DC | PRN
  Filled 2021-12-22: qty 20

## 2021-12-22 MED ORDER — ADULT MULTIVITAMIN W/MINERALS CH
1.0000 | ORAL_TABLET | Freq: Every day | ORAL | Status: DC
  Administered 2021-12-22 – 2021-12-25 (×4): 1 via ORAL
  Filled 2021-12-22 (×4): qty 1

## 2021-12-22 MED ORDER — LORAZEPAM 1 MG PO TABS
1.0000 mg | ORAL_TABLET | Freq: Two times a day (BID) | ORAL | Status: AC
  Administered 2021-12-25 (×2): 1 mg via ORAL
  Filled 2021-12-22 (×2): qty 1

## 2021-12-22 MED ORDER — TRAZODONE HCL 50 MG PO TABS
50.0000 mg | ORAL_TABLET | Freq: Every evening | ORAL | Status: DC | PRN
  Administered 2021-12-24 – 2021-12-25 (×2): 50 mg via ORAL
  Filled 2021-12-22: qty 14
  Filled 2021-12-22 (×2): qty 1

## 2021-12-22 MED ORDER — LORAZEPAM 1 MG PO TABS
1.0000 mg | ORAL_TABLET | Freq: Four times a day (QID) | ORAL | Status: AC | PRN
  Administered 2021-12-24: 1 mg via ORAL

## 2021-12-22 MED ORDER — HYDROCHLOROTHIAZIDE 12.5 MG PO TABS
12.5000 mg | ORAL_TABLET | Freq: Every day | ORAL | Status: DC
  Administered 2021-12-22 – 2021-12-25 (×4): 12.5 mg via ORAL
  Filled 2021-12-22 (×4): qty 1
  Filled 2021-12-22: qty 14

## 2021-12-22 MED ORDER — ALUM & MAG HYDROXIDE-SIMETH 200-200-20 MG/5ML PO SUSP: 30.0000 mL | ORAL | Status: DC | PRN

## 2021-12-22 MED ORDER — LORAZEPAM 1 MG PO TABS
1.0000 mg | ORAL_TABLET | Freq: Four times a day (QID) | ORAL | Status: AC
  Administered 2021-12-22 – 2021-12-23 (×6): 1 mg via ORAL
  Filled 2021-12-22 (×6): qty 1

## 2021-12-22 MED ORDER — ONDANSETRON 4 MG PO TBDP: 4.0000 mg | ORAL_TABLET | Freq: Four times a day (QID) | ORAL | Status: AC | PRN

## 2021-12-22 MED ORDER — LORAZEPAM 1 MG PO TABS
1.0000 mg | ORAL_TABLET | Freq: Three times a day (TID) | ORAL | Status: AC
  Administered 2021-12-24 (×3): 1 mg via ORAL
  Filled 2021-12-22 (×3): qty 1

## 2021-12-22 MED ORDER — MAGNESIUM HYDROXIDE 400 MG/5ML PO SUSP: 30.0000 mL | Freq: Every day | ORAL | Status: DC | PRN

## 2021-12-22 MED ORDER — LOPERAMIDE HCL 2 MG PO CAPS: 2.0000 mg | ORAL_CAPSULE | ORAL | Status: AC | PRN

## 2021-12-22 MED ORDER — THIAMINE HCL 100 MG PO TABS
100.0000 mg | ORAL_TABLET | Freq: Every day | ORAL | Status: DC
  Administered 2021-12-23 – 2021-12-25 (×3): 100 mg via ORAL
  Filled 2021-12-22 (×3): qty 1

## 2021-12-22 MED ORDER — THIAMINE HCL 100 MG/ML IJ SOLN
100.0000 mg | Freq: Once | INTRAMUSCULAR | Status: AC
  Administered 2021-12-22: 100 mg via INTRAMUSCULAR
  Filled 2021-12-22: qty 2

## 2021-12-22 NOTE — ED Notes (Signed)
Pt has been brought on unit and acclimated with unit. PT is currently in here room

## 2021-12-22 NOTE — Progress Notes (Signed)
Patient assessed by provider. Patient denies SOB, chest pain, LE edema, and headaches. Patient was informed that she was prescribed hydrochlorothiazide 12.5 mg back in February 2022 for hypertension. Patient states that she never started the blood pressure medication last year.   This provider spoke with  Dr. Roslynn Amble regarding patient's elevated b/p readings: 170/119 at 1819 and repeat manual b/p 180/120. Patient is asymptomatic.   Patient's chart reviewed by Dr. Roslynn Amble who recommends restarting the patient's home medication hydrochlorothiazide 12.5 mg by mouth daily. Dr. Roslynn Amble request provider update him in one hour on the patient's b/p-status. This provider will report off to the oncoming NP Lindon Romp.  Hydrochlorothiazide 12.5 mg by mouth daily restarted for HTN.

## 2021-12-22 NOTE — ED Notes (Signed)
Pt has taken her medications and is now in the bed

## 2021-12-22 NOTE — ED Triage Notes (Signed)
Pt Diane Dickson presents to [facility] with a complaint of an alcohol problem. Pt states that she drinks daily and wants to seek substance use treatment.Pt states that her last drink was lastnight and she has about 5 shots. Pt denies withdrawal symptoms. Pt states that she would benefit from rehabilitation resources.Pt denies SI/HI and AVH. Pt is routine

## 2021-12-22 NOTE — Progress Notes (Signed)
Pt is admitted to Continuous observation due to substance abuse. Pt is alert and oriented with flat affect. Pt is ambulatory and is oriented to staff/unit. Pt reported a recent fall due to drunkenness. Pt is a high fall risk and was educated on fall prevention. Pt was receptive. Pt was cooperative with labs and skin assessment. Superficial lacerations noted on pt's left wrist. Pt's right wrist is swollen and bruised due to recent fall. PRN Tylenol given for wrist pain. Pt denies current SI/HI/AVH. Staff will monitor for pt's safety.

## 2021-12-22 NOTE — Progress Notes (Signed)
Pt's CIWA was 1. 

## 2021-12-22 NOTE — ED Notes (Signed)
Pt AAO x 4, resting at present,  Watching TV at present, no distress noted.  Monitoring for safety.  Pending Transfer to [facility].

## 2021-12-22 NOTE — ED Provider Notes (Addendum)
Behavioral Health Admission H&P [facility](FBC & OBS)  Date: 12/22/21 Patient Name: Diane Dickson MRN: [id number] Chief Complaint:  Chief Complaint  Patient presents with   Alcohol Problem      Diagnoses:  Final diagnoses:  Alcohol abuse  Substance induced mood disorder (HCC)    HPI: Diane Dickson is a 26 year old female patient who [facility] accompanied by her grandmother and friend Alecia LemmingVictor with a chief complaint of "alcohol problem."  Patient seen and evaluated face-to-face by this provider, chart reviewed and case discussed with Dr. Bronwen BettersLaubach. On evaluation, patient is alert and oriented x4. Her thought process is logical and relevant. She is goal oriented.  Her mood is depressed and affect is congruent.   Patient reports that she has been drinking alcohol heavily for a couple of years. She reports that she first started drinking when she was 26 years old. She reports drinking a bottle of liquor every other day. She reports that her last drink was last night and states that she drank 5 shots of liquor. Patient denies withdrawal symptoms at this time. Patient denies any past alcohol withdrawal seizures and DTs. Patient states that in the past she has had to go the the emergency department for fluids when withdrawing from alcohol. Patient reports smoking THC occasionally. She reports taking an "E" pill once months ago.   Patient denies a past hx of receiving alcohol detox or residential tx. She states that she was court ordered to attend the [facility] for outpatient substance abuse treatment but stopped going in November or December 2022 because it was not enough.   Patient denies suicidal ideations. Patient denies past history of attempted suicide. Patient endorses self injures behaviors by cutting. She reports that she last cut a couple days ago.  She is noted to have superficial cuts to her left forearm. She reports that prior to a couple  days ago she last cut 3 years ago. She reports that she started cutting when she was in middle school. Patient denies homicidal ideation. Patient denies auditory and visual hallucinations.  There is no objective evidence that the patient is currently responding to internal or external stimuli.  Patient reports feeling depressed for years and describes her depressive symptoms as crying all the time, having no purpose in life, feeling useless, and feeling like people would be better off without her.  Patient denies a past psychiatric history. Patient reports that she resides with her grandmother. Patient reports a medical history of high blood pressure and denies being prescribed antihypertensive medications.      PHQ 2-9:   Flowsheet Row [facility] to Hosp-Admission (Discharged) from 01/03/2021 in [facility]ICU/STEPDOWN  C-SSRS RISK CATEGORY No Risk No Risk Moderate Risk        Total Time spent with patient: 30 minutes  Musculoskeletal  Strength & Muscle Tone: within normal limits Gait & Station: normal Patient leans: N/A  Psychiatric Specialty Exam  Presentation General Appearance: Appropriate for Environment  Eye Contact:Fair  Speech:Clear and Coherent  Speech Volume:Normal  Handedness:No data recorded  Mood and Affect  Mood:Depressed  Affect:Congruent   Thought Process  Thought Processes:Coherent  Descriptions of Associations:Intact  Orientation:Full (Time, Place and Person)  Thought Content:Logical    Hallucinations:Hallucinations: None  Ideas of Reference:None  Suicidal Thoughts:Suicidal Thoughts: No  Homicidal Thoughts:Homicidal Thoughts: No   Sensorium  Memory:Immediate  Fair; Recent Fair; Remote Fair  Judgment:Fair  Insight:Fair   Executive Functions  Concentration:Fair  Attention  Span:Fair  Recall:Fair  [employer] of Knowledge:Fair  Language:Fair   Psychomotor Activity  Psychomotor Activity:Psychomotor Activity: Normal   Assets  Assets:Communication Skills; Desire for Improvement; Housing; Leisure Time; Physical Health; Social Support; Resilience; Vocational/Educational   Sleep  Sleep:Sleep: Poor   Nutritional Assessment (For OBS and FBC admissions only) Has the patient had a weight loss or gain of 10 pounds or more in the last 3 months?: No Has the patient had a decrease in food intake/or appetite?: Yes Does the patient have dental problems?: No Does the patient have eating habits or behaviors that may be indicators of an eating disorder including binging or inducing vomiting?: No Has the patient recently lost weight without trying?: 2.0 Has the patient been eating poorly because of a decreased appetite?: 1 Malnutrition Screening Tool Score: 3 Nutritional Assessment Referrals: Medication/Tx changes    Physical Exam Constitutional:      Appearance: Normal appearance.  HENT:     Head: Normocephalic and atraumatic.     Nose: Nose normal.  Eyes:     Conjunctiva/sclera: Conjunctivae normal.  Cardiovascular:     Rate and Rhythm: Normal rate.  Pulmonary:     Effort: Pulmonary effort is normal.  Musculoskeletal:        General: Normal range of motion.     Cervical back: Normal range of motion.  Neurological:     Mental Status: She is alert and oriented to person, place, and time.   Review of Systems  Constitutional: Negative.   HENT: Negative.    Eyes: Negative.   Respiratory: Negative.    Cardiovascular: Negative.   Gastrointestinal: Negative.   Genitourinary: Negative.   Musculoskeletal: Negative.   Skin: Negative.   Neurological: Negative.   Endo/Heme/Allergies: Negative.   Psychiatric/Behavioral:  Positive for substance abuse.    Last menstrual period 11/25/2021. There is no height or weight on file to calculate BMI.  Past Psychiatric  History: alcohol use dx   Is the patient at risk to self? No  Has the patient been a risk to self in the past 6 months? No .    Has the patient been a risk to self within the distant past? No   Is the patient a risk to others? No   Has the patient been a risk to others in the past 6 months? No   Has the patient been a risk to others within the distant past? No   Past Medical History:  Past Medical History:  Diagnosis Date   Alcohol abuse    Bipolar 1 disorder (HCC)    Pneumomediastinum (HCC)    PTSD (post-traumatic stress disorder)    No past surgical history on file.  Family History:  Family History  Problem Relation Age of Onset   Healthy Mother     Social History:  Social History   Socioeconomic History   Marital status: Single    Spouse name: Not on file   Number of children: Not on file   Years of education: Not on file   Highest education level: Not on file  Occupational History   Not on file  Tobacco Use   Smoking status: Never   Smokeless tobacco: Never  Vaping Use   Vaping Use: Some days   Substances: Nicotine  Substance and Sexual Activity   Alcohol use: Yes    Comment: daily   Drug use: Never   Sexual activity:  Not on file  Other Topics Concern   Not on file  Social History Narrative   Not on file   Social Determinants of Health   Financial Resource Strain: Not on file  Food Insecurity: Not on file  Transportation Needs: Not on file  Physical Activity: Not on file  Stress: Not on file  Social Connections: Not on file  Intimate Partner Violence: Not on file    SDOH:  SDOH Screenings   Alcohol Screen: Not on file  Depression (PHQ2-9): Not on file  Financial Resource Strain: Not on file  Food Insecurity: Not on file  Housing: Not on file  Physical Activity: Not on file  Social Connections: Not on file  Stress: Not on file  Tobacco Use: Low Risk    Smoking Tobacco Use: Never   Smokeless Tobacco Use: Never   Passive Exposure: Not on  file  Transportation Needs: Not on file    Last Labs:  Admission on 12/09/2021, Discharged on 12/09/2021  Component Date Value Ref Range Status   WBC 12/09/2021 3.1 (L)  4.0 - 10.5 K/uL Final   RBC 12/09/2021 4.23  3.87 - 5.11 MIL/uL Final   Hemoglobin 12/09/2021 14.3  12.0 - 15.0 g/dL Final   HCT 86/57/8469 43.3  36.0 - 46.0 % Final   MCV 12/09/2021 102.4 (H)  80.0 - 100.0 fL Final   MCH 12/09/2021 33.8  26.0 - 34.0 pg Final   MCHC 12/09/2021 33.0  30.0 - 36.0 g/dL Final   RDW 62/95/2841 14.5  11.5 - 15.5 % Final   Platelets 12/09/2021 263  150 - 400 K/uL Final   nRBC 12/09/2021 0.0  0.0 - 0.2 % Final   Neutrophils Relative % 12/09/2021 42  % Final   Neutro Abs 12/09/2021 1.3 (L)  1.7 - 7.7 K/uL Final   Lymphocytes Relative 12/09/2021 51  % Final   Lymphs Abs 12/09/2021 1.6  0.7 - 4.0 K/uL Final   Monocytes Relative 12/09/2021 6  % Final   Monocytes Absolute 12/09/2021 0.2  0.1 - 1.0 K/uL Final   Eosinophils Relative 12/09/2021 0  % Final   Eosinophils Absolute 12/09/2021 0.0  0.0 - 0.5 K/uL Final   Basophils Relative 12/09/2021 1  % Final   Basophils Absolute 12/09/2021 0.0  0.0 - 0.1 K/uL Final   Immature Granulocytes 12/09/2021 0  % Final   Abs Immature Granulocytes 12/09/2021 0.00  0.00 - 0.07 K/uL Final   Performed at [facility], [address], [place], [place] [address]   Sodium 12/09/2021 144  135 - 145 mmol/L Final   Potassium 12/09/2021 3.8  3.5 - 5.1 mmol/L Final   Chloride 12/09/2021 107  98 - 111 mmol/L Final   CO2 12/09/2021 26  22 - 32 mmol/L Final   Glucose, Bld 12/09/2021 72  70 - 99 mg/dL Final   Glucose reference range applies only to samples taken after fasting for at least 8 hours.   BUN 12/09/2021 13  6 - 20 mg/dL Final   Creatinine, Ser 12/09/2021 0.49  0.44 - 1.00 mg/dL Final   Calcium 09/09/2535 8.7 (L)  8.9 - 10.3 mg/dL Final   Total Protein 64/40/3474 7.5  6.5 - 8.1 g/dL Final   Albumin 25/95/6387 3.8  3.5 - 5.0 g/dL Final    AST 56/43/3295 101 (H)  15 - 41 U/L Final   ALT 12/09/2021 41  0 - 44 U/L Final   Alkaline Phosphatase 12/09/2021 57  38 - 126 U/L Final  Total Bilirubin 12/09/2021 0.6  0.3 - 1.2 mg/dL Final   GFR, Estimated 12/09/2021 >60  >60 mL/min Final   Comment: (NOTE) Calculated using the CKD-EPI Creatinine Equation (2021)    Anion gap 12/09/2021 11  5 - 15 Final   Performed at [facility], [address] [place], [place] [address]   Lipase 12/09/2021 31  11 - 51 U/L Final   Performed at [facility], [address], [place], [place] [address]   Color, Urine 12/09/2021 YELLOW  YELLOW Final   APPearance 12/09/2021 HAZY (A)  CLEAR Final   Specific Gravity, Urine 12/09/2021 1.020  1.005 - 1.030 Final   pH 12/09/2021 7.0  5.0 - 8.0 Final   Glucose, UA 12/09/2021 NEGATIVE  NEGATIVE mg/dL Final   Hgb urine dipstick 12/09/2021 NEGATIVE  NEGATIVE Final   Bilirubin Urine 12/09/2021 NEGATIVE  NEGATIVE Final   Ketones, ur 12/09/2021 20 (A)  NEGATIVE mg/dL Final   Protein, ur 62/83/6629 30 (A)  NEGATIVE mg/dL Final   Nitrite 47/65/4650 NEGATIVE  NEGATIVE Final   Leukocytes,Ua 12/09/2021 TRACE (A)  NEGATIVE Final   RBC / HPF 12/09/2021 0-5  0 - 5 RBC/hpf Final   WBC, UA 12/09/2021 6-10  0 - 5 WBC/hpf Final   Bacteria, UA 12/09/2021 RARE (A)  NONE SEEN Final   Squamous Epithelial / LPF 12/09/2021 11-20  0 - 5 Final   Mucus 12/09/2021 PRESENT   Final   Performed at [facility], [address], [place], [place] [address]   I-stat hCG, quantitative 12/09/2021 <5.0  <5 mIU/mL Final   Comment 3 12/09/2021          Final   Comment:   GEST. AGE      CONC.  (mIU/mL)   <=1 WEEK        5 - 50     2 WEEKS       50 - 500     3 WEEKS       100 - 10,000     4 WEEKS     1,000 - 30,000        FEMALE AND NON-PREGNANT FEMALE:     LESS THAN 5 mIU/mL    Alcohol, Ethyl (B) 12/09/2021 430 (HH)  <10 mg/dL Final   Comment: CRITICAL RESULT CALLED TO, READ BACK BY  AND VERIFIED WITH: K.LIVINGSTON, EMT AT 1759 ON 01.27.23 BY N.THOMPSON. (NOTE) Lowest detectable limit for serum alcohol is 10 mg/dL.  For medical purposes only. Performed at [facility], [address], [place], [place] [address]     Allergies: Patient has no known allergies.  PTA Medications: (Not in a hospital admission)   Medical Decision Making  Patient admitted to the [facility] facility based crisis for alcohol detox.   Lab Orders         Resp Panel by RT-PCR (Flu A&B, Covid) Nasopharyngeal Swab         CBC with Differential/Platelet         Comprehensive metabolic panel         Hemoglobin A1c         Ethanol         TSH         Lipid panel         Pregnancy, urine         POCT Urine Drug Screen - ([facility])         POC SARS Coronavirus 2 Ag-ED - Nasal Swab  EKG  Meds ordered this encounter  Medications   acetaminophen (TYLENOL) tablet 650 mg   alum & mag hydroxide-simeth (MAALOX/MYLANTA) 200-200-20 MG/5ML suspension 30 mL   magnesium hydroxide (MILK OF MAGNESIA) suspension 30 mL   hydrOXYzine (ATARAX) tablet 25 mg   traZODone (DESYREL) tablet 50 mg   thiamine (B-1) injection 100 mg   thiamine tablet 100 mg   multivitamin with minerals tablet 1 tablet   LORazepam (ATIVAN) tablet 1 mg   loperamide (IMODIUM) capsule 2-4 mg   ondansetron (ZOFRAN-ODT) disintegrating tablet 4 mg   FOLLOWED BY Linked Order Group    LORazepam (ATIVAN) tablet 1 mg    LORazepam (ATIVAN) tablet 1 mg    LORazepam (ATIVAN) tablet 1 mg    LORazepam (ATIVAN) tablet 1 mg   Recommendations  Based on my evaluation the patient does not appear to have an emergency medical condition.  Layla BarterWhite, Naydeen Speirs L, NP 12/22/21  4:57 PM

## 2021-12-22 NOTE — ED Provider Notes (Signed)
Patient's blood pressure rechecked at 2054 to be 150/89.  Per chart review, patient is BP at 2000 was 140/90.  Specifically, these 2054 2000 blood pressure values are improved compared to patient's previous blood pressure values of 170/19 (taken by automatic blood pressure cuff) and manual blood pressure of 180/120 at 1819 (see previous 12/22/21 provider note for details). Patient reassessed face-to-face by this provider on the [facility] unit.  Patient denies any headache, lightheadedness, dizziness, vision changes, chest pain, shortness of breath, palpitations, nausea, vomiting, abdominal pain, or any additional physical symptoms at this time.  Per chart review, patient's previous home medication of hydrochlorothiazide 12.5 mg p.o. daily has been ordered per her earlier recommendations of Dr. Roslynn Amble (see previous 12/22/2021 provider note for details).  This provider updated Dr. Roslynn Amble [facility] EDP) via phone on patient's most recent blood pressure readings noted above and patient's current presentation. Per Dr. Roslynn Amble, based on patient's updated blood pressure values and the patient's current presentation, the patient does not appear to be experiencing an emergent medical condition at this time and does need further emergent medical evaluation/work-up at this time and patient may remain at [facility] at this time.  Per Dr. Roslynn Amble, it is also recommended for patient to continue her home medication of hydrochlorothiazide 12.5 mg p.o. daily and that the patient is also appropriate/recommended to follow up with her outpatient PCP regarding her hypertension upon future discharge from [facility].  Per Dr. Roslynn Amble, patient's vital signs may be checked per [facility] unit routine protocol and vital sign checks do not need to be increased/adjusted at this time.  We will continue patient's previous home medication of hydrochlorothiazide 12.5 mg p.o. daily for hypertension at this time.

## 2021-12-22 NOTE — ED Notes (Signed)
Pt has taken her medications and is in bed sleeping, respirations are even and unlabored, will continue to monitor patient for safety.

## 2021-12-23 MED ORDER — ESCITALOPRAM OXALATE 10 MG PO TABS
10.0000 mg | ORAL_TABLET | Freq: Every day | ORAL | Status: DC
  Administered 2021-12-23 – 2021-12-25 (×3): 10 mg via ORAL
  Filled 2021-12-23 (×2): qty 14
  Filled 2021-12-23 (×3): qty 1

## 2021-12-23 MED ORDER — POTASSIUM CHLORIDE CRYS ER 20 MEQ PO TBCR
40.0000 meq | EXTENDED_RELEASE_TABLET | Freq: Two times a day (BID) | ORAL | Status: AC
  Administered 2021-12-23 – 2021-12-24 (×3): 40 meq via ORAL
  Filled 2021-12-23 (×3): qty 2

## 2021-12-23 NOTE — ED Notes (Signed)
Pt is currently sleeping, no distress noted, environmental check complete, will continue to monitor patient for safety. ? ?

## 2021-12-23 NOTE — ED Notes (Signed)
Pt Aox4. No complaint of pain or discomfort at this time.  Breathing is even and unlabored.  Pt up and provided breakfast.  Pt also participating in 1000 group.  Will continue to monitor for safety.

## 2021-12-23 NOTE — ED Notes (Signed)
In group room participating in group facilitated by chaplin.  Breathing is even and unlabored.  Will continue to monitor for safety. °

## 2021-12-23 NOTE — Progress Notes (Signed)
SPIRITUALITY GROUP NOTE  Spirituality group facilitated by Wilkie Aye, MDiv, B[facility].  Group Description:  Group focused on topic of hope.  Patients participated in facilitated discussion around topic, connecting with one another around experiences and definitions for hope.  Group members engaged in facilitated discussion reflecting on the phrase "hope is."  Group engaged in discussion around how their definitions of hope are present today in hospital.   Modalities: Psycho-social ed, Adlerian, Narrative, MI Patient Progress: Ermine was present throughout group.  Engaged in discussion with chaplain and other group members.

## 2021-12-23 NOTE — ED Notes (Signed)
Manual blood pressure taken due to  elevated BP.  Resulting pressure 140/100, provider White, NP notified.  No new orders at this time.  Will continue to monitor BP per provider request due to starting new BP medication.

## 2021-12-23 NOTE — Clinical Social Work Psych Note (Addendum)
LCSW Initial Note ° °LCSW met with Diane Dickson for introduction and to begin discussions regarding treatment and potential discharge planning.  ° °Diane Dickson presented with a depressed affect, congruent mood. She denied having any SI, HI or AVH at this time.  ° °Diane Dickson shared that she came to the [facility] seeking assistance for excessive alcohol use.  °Diane Dickson reports drinking a "whole bottle of liquor, every other day". She reports she began drinking alcohol at the age of 26yo, and it has increased since.  ° °Diane Dickson report she was court ordered to participate in an intensive outpatient program at [facility] for her her alcohol use, however she stopped going to the program in December 2022 due to feeling it was ineffective.  ° °Diane Dickson shared that she has an upcoming court date on 02/14/22 due to being charged with a DUI and driving with a revoked license. Diane Dickson reports she currently lives with her grandmother. She identified her mother, step-father and grandmother as her main supports.  ° °Diane Dickson requested to be referred to a residential treatment program for continuity of care, once she completed her detox services at the [facility].  ° °Diane Dickson was started on an Ativan taper to address withdrawal symptoms. Her taper ends Monday, 12/26/21.  ° °Diane Dickson is currently uninsured. LCSW referred the patient to following facilities for review: ° °[facility] °[facility] ° ° °LCSW will continue to follow for possible placement.  ° °Jolan Williams, MSW, LCSW °Clinical Social Worker (Facility Based Crisis) °[facility]  ° °

## 2021-12-23 NOTE — ED Notes (Signed)
Appears to be resting quietly at this time respirations even and unlabored , no distress noted . Will continue to monitor for safety .

## 2021-12-23 NOTE — ED Provider Notes (Signed)
Behavioral Health Progress Note  Date and Time: 12/23/2021 2:45 PM Name: Diane Dickson MRN:  [id number]  Subjective:   26 year old female who presented to the [facility] behavioral health urgent care on 12/22/21 accompanied by her grandmother and a friend with chief complaint of "alcohol problem" and requesting substance use treatment.  Patient was admitted to the [facility] for alcohol detox and started on Ativan taper.  Patient seen and chart reviewed-she has been medication compliant and has been appropriate with staff and peers on the unit.  States that she is doing "okay" today and describes her mood is "fine".  She reports sleeping well and denies issues with appetite.  She recounts what led to hospitalization as per H&P and states that she would like assistance with her alcohol use.  Patient states that she has a court date upcoming on April 4 and would like to receive treatment prior to this.  She expresses interest in residential rehab after completing period of detox.  She denies any alcohol withdrawal symptoms of nausea, vomiting, headache, tremors, GI upset.  She denies SI/HI/AVH.  She denies suicide attempts.  She denies previous psychiatric hospitalizations or ever taking psychiatric medications in the past.  She states that she would be interested in starting a medication for her depression.  After discussing R/B/AE/SE of Lexapro she is amenable to starting this medication today.  She denies all other physical complaints and denies any concerns.  Informed patient that he has been started on Ativan taper and that this is scheduled to end on Monday morning at which time she will likely be transferred to a substance use facility.  Patient verbalized understanding.Patient was given the opportunity to ask questions and  All questions answered. Patient verbalized understanding regarding plan of care.     Diagnosis:  Final diagnoses:  Alcohol abuse  Substance induced mood disorder (HCC)     Total Time spent with patient: 20 minutes  Past Psychiatric History: alcohol use Past Medical History:  Past Medical History:  Diagnosis Date   Alcohol abuse    Bipolar 1 disorder ([place])    Pneumomediastinum (HCC)    PTSD (post-traumatic stress disorder)    No past surgical history on file. Family History:  Family History  Problem Relation Age of Onset   Healthy Mother    Family Psychiatric  History: none reported Social History:  Social History   Substance and Sexual Activity  Alcohol Use Yes   Comment: daily     Social History   Substance and Sexual Activity  Drug Use Never    Social History   Socioeconomic History   Marital status: Single    Spouse name: Not on file   Number of children: Not on file   Years of education: Not on file   Highest education level: Not on file  Occupational History   Not on file  Tobacco Use   Smoking status: Never   Smokeless tobacco: Never  Vaping Use   Vaping Use: Some days   Substances: Nicotine  Substance and Sexual Activity   Alcohol use: Yes    Comment: daily   Drug use: Never   Sexual activity: Not on file  Other Topics Concern   Not on file  Social History Narrative   Not on file   Social Determinants of Health   Financial Resource Strain: Not on file  Food Insecurity: Not on file  Transportation Needs: Not on file  Physical Activity: Not on file  Stress: Not on file  Social Connections: Not on file   SDOH:  SDOH Screenings   Alcohol Screen: Not on file  Depression (PHQ2-9): Not on file  Financial Resource Strain: Not on file  Food Insecurity: Not on file  Housing: Not on file  Physical Activity: Not on file  Social Connections: Not on file  Stress: Not on file  Tobacco Use: Low Risk    Smoking Tobacco Use: Never   Smokeless Tobacco Use: Never   Passive Exposure: Not on file  Transportation Needs: Not on file   Additional Social History:    Pain Medications: None reported Prescriptions:  Patient denies Over the Counter: Patient denies History of alcohol / drug use?: Yes Longest period of sobriety (when/how long): During childhood; Patient endorsed excessive alcohol use since the age of 26yo. Negative Consequences of Use: Legal Withdrawal Symptoms: None Name of Substance 1: Alcohol (ETOH) 1 - Age of First Use: 26yo 1 - Amount (size/oz): "A whole bottle" 1 - Frequency: Daily; "Every other day" 1 - Duration: 5 years; Since the age of 26yo 1 - Last Use / Amount: 5 shots of liquor 1 - Method of Aquiring: Purchase from a store 1- Route of Use: Oral                  Sleep: Fair  Appetite:  Good  Current Medications:  Current Facility-Administered Medications  Medication Dose Route Frequency Provider Last Rate Last Admin   acetaminophen (TYLENOL) tablet 650 mg  650 mg Oral Q6H PRN White, Patrice L, NP   650 mg at 12/22/21 1810   alum & mag hydroxide-simeth (MAALOX/MYLANTA) 200-200-20 MG/5ML suspension 30 mL  30 mL Oral Q4H PRN White, Patrice L, NP       escitalopram (LEXAPRO) tablet 10 mg  10 mg Oral Daily Ival Bible, MD       hydrochlorothiazide (HYDRODIURIL) tablet 12.5 mg  12.5 mg Oral Daily White, Patrice L, NP   12.5 mg at 12/23/21 1032   hydrOXYzine (ATARAX) tablet 25 mg  25 mg Oral TID PRN White, Patrice L, NP       loperamide (IMODIUM) capsule 2-4 mg  2-4 mg Oral PRN White, Patrice L, NP       LORazepam (ATIVAN) tablet 1 mg  1 mg Oral Q6H PRN White, Patrice L, NP       LORazepam (ATIVAN) tablet 1 mg  1 mg Oral QID White, Patrice L, NP   1 mg at 12/23/21 1339   Followed by   Derrill Memo ON 12/24/2021] LORazepam (ATIVAN) tablet 1 mg  1 mg Oral TID White, Patrice L, NP       Followed by   Derrill Memo ON 12/25/2021] LORazepam (ATIVAN) tablet 1 mg  1 mg Oral BID White, Patrice L, NP       Followed by   Derrill Memo ON 12/26/2021] LORazepam (ATIVAN) tablet 1 mg  1 mg Oral Daily White, Patrice L, NP       magnesium hydroxide (MILK OF MAGNESIA) suspension 30 mL  30 mL Oral  Daily PRN White, Patrice L, NP       multivitamin with minerals tablet 1 tablet  1 tablet Oral Daily White, Patrice L, NP   1 tablet at 12/23/21 1032   ondansetron (ZOFRAN-ODT) disintegrating tablet 4 mg  4 mg Oral Q6H PRN White, Patrice L, NP       potassium chloride SA (KLOR-CON M) CR tablet 40 mEq  40 mEq Oral BID Ival Bible, MD   40 mEq at  12/23/21 1032   thiamine tablet 100 mg  100 mg Oral Daily White, Patrice L, NP   100 mg at 12/23/21 1032   traZODone (DESYREL) tablet 50 mg  50 mg Oral QHS PRN White, Patrice L, NP       No current outpatient medications on file.    Labs  Lab Results:  Admission on 12/22/2021  Component Date Value Ref Range Status   SARS Coronavirus 2 by RT PCR 12/22/2021 NEGATIVE  NEGATIVE Final   Comment: (NOTE) SARS-CoV-2 target nucleic acids are NOT DETECTED.  The SARS-CoV-2 RNA is generally detectable in upper respiratory specimens during the acute phase of infection. The lowest concentration of SARS-CoV-2 viral copies this assay can detect is 138 copies/mL. A negative result does not preclude SARS-Cov-2 infection and should not be used as the sole basis for treatment or other patient management decisions. A negative result may occur with  improper specimen collection/handling, submission of specimen other than nasopharyngeal swab, presence of viral mutation(s) within the areas targeted by this assay, and inadequate number of viral copies(<138 copies/mL). A negative result must be combined with clinical observations, patient history, and epidemiological information. The expected result is Negative.  Fact Sheet for Patients:  EntrepreneurPulse.com.au  Fact Sheet for Healthcare Providers:  IncredibleEmployment.be  This test is no                          t yet approved or cleared by the [place] FDA and  has been authorized for detection and/or diagnosis of SARS-CoV-2 by FDA under an Emergency Use  Authorization (EUA). This EUA will remain  in effect (meaning this test can be used) for the duration of the COVID-19 declaration under Section 564(b)(1) of the Act, 21 U.S.C.section 360bbb-3(b)(1), unless the authorization is terminated  or revoked sooner.       Influenza A by PCR 12/22/2021 NEGATIVE  NEGATIVE Final   Influenza B by PCR 12/22/2021 NEGATIVE  NEGATIVE Final   Comment: (NOTE) The Xpert Xpress SARS-CoV-2/FLU/RSV plus assay is intended as an aid in the diagnosis of influenza from Nasopharyngeal swab specimens and should not be used as a sole basis for treatment. Nasal washings and aspirates are unacceptable for Xpert Xpress SARS-CoV-2/FLU/RSV testing.  Fact Sheet for Patients: EntrepreneurPulse.com.au  Fact Sheet for Healthcare Providers: IncredibleEmployment.be  This test is not yet approved or cleared by the [place] FDA and has been authorized for detection and/or diagnosis of SARS-CoV-2 by FDA under an Emergency Use Authorization (EUA). This EUA will remain in effect (meaning this test can be used) for the duration of the COVID-19 declaration under Section 564(b)(1) of the Act, 21 U.S.C. section 360bbb-3(b)(1), unless the authorization is terminated or revoked.  Performed at [facility], [place] [address] [place], [place] [address]    WBC 12/22/2021 4.4  4.0 - 10.5 K/uL Final   RBC 12/22/2021 3.94  3.87 - 5.11 MIL/uL Final   Hemoglobin 12/22/2021 13.4  12.0 - 15.0 g/dL Final   HCT 12/22/2021 40.2  36.0 - 46.0 % Final   MCV 12/22/2021 102.0 (H)  80.0 - 100.0 fL Final   MCH 12/22/2021 34.0  26.0 - 34.0 pg Final   MCHC 12/22/2021 33.3  30.0 - 36.0 g/dL Final   RDW 12/22/2021 13.3  11.5 - 15.5 % Final   Platelets 12/22/2021 304  150 - 400 K/uL Final   nRBC 12/22/2021 0.0  0.0 - 0.2 % Final   Neutrophils Relative %  12/22/2021 61  % Final   Neutro Abs 12/22/2021 2.7  1.7 - 7.7 K/uL Final   Lymphocytes Relative  12/22/2021 29  % Final   Lymphs Abs 12/22/2021 1.3  0.7 - 4.0 K/uL Final   Monocytes Relative 12/22/2021 8  % Final   Monocytes Absolute 12/22/2021 0.3  0.1 - 1.0 K/uL Final   Eosinophils Relative 12/22/2021 1  % Final   Eosinophils Absolute 12/22/2021 0.0  0.0 - 0.5 K/uL Final   Basophils Relative 12/22/2021 1  % Final   Basophils Absolute 12/22/2021 0.0  0.0 - 0.1 K/uL Final   Immature Granulocytes 12/22/2021 0  % Final   Abs Immature Granulocytes 12/22/2021 0.01  0.00 - 0.07 K/uL Final   Performed at [facility], [address] [place], [place] [address]   Sodium 12/22/2021 137  135 - 145 mmol/L Final   Potassium 12/22/2021 3.1 (L)  3.5 - 5.1 mmol/L Final   Chloride 12/22/2021 97 (L)  98 - 111 mmol/L Final   CO2 12/22/2021 27  22 - 32 mmol/L Final   Glucose, Bld 12/22/2021 78  70 - 99 mg/dL Final   Glucose reference range applies only to samples taken after fasting for at least 8 hours.   BUN 12/22/2021 5 (L)  6 - 20 mg/dL Final   Creatinine, Ser 12/22/2021 0.63  0.44 - 1.00 mg/dL Final   Calcium 08/67/6195 9.2  8.9 - 10.3 mg/dL Final   Total Protein 09/32/6712 7.3  6.5 - 8.1 g/dL Final   Albumin 45/80/9983 3.8  3.5 - 5.0 g/dL Final   AST 38/25/0539 46 (H)  15 - 41 U/L Final   ALT 12/22/2021 35  0 - 44 U/L Final   Alkaline Phosphatase 12/22/2021 71  38 - 126 U/L Final   Total Bilirubin 12/22/2021 0.6  0.3 - 1.2 mg/dL Final   GFR, Estimated 12/22/2021 >60  >60 mL/min Final   Comment: (NOTE) Calculated using the CKD-EPI Creatinine Equation (2021)    Anion gap 12/22/2021 13  5 - 15 Final   Performed at [facility], [address], [place], [place] [address]   Hgb A1c MFr Bld 12/22/2021 4.9  4.8 - 5.6 % Final   Comment: (NOTE) Pre diabetes:          5.7%-6.4%  Diabetes:              >6.4%  Glycemic control for   <7.0% adults with diabetes    Mean Plasma Glucose 12/22/2021 93.93  mg/dL Final   Performed at [facility], [address], [place], [place]  [address]   Alcohol, Ethyl (B) 12/22/2021 <10  <10 mg/dL Final   Comment: (NOTE) Lowest detectable limit for serum alcohol is 10 mg/dL.  For medical purposes only. Performed at [facility], [address] [place], [place] [address]    TSH 12/22/2021 1.816  0.350 - 4.500 uIU/mL Final   Comment: Performed by a 3rd Generation assay with a functional sensitivity of <=0.01 uIU/mL. Performed at [facility], [address], [place], [place] [address]    Cholesterol 12/22/2021 253 (H)  0 - 200 mg/dL Final   Triglycerides 99/24/2683 91  <150 mg/dL Final   HDL 41/96/2229 132  >40 mg/dL Final   Total CHOL/HDL Ratio 12/22/2021 1.9  RATIO Final   VLDL 12/22/2021 18  0 - 40 mg/dL Final   LDL Cholesterol 12/22/2021 103 (H)  0 - 99 mg/dL Final  Comment:        Total Cholesterol/HDL:CHD Risk Coronary Heart Disease Risk Table                     Men   Women  1/2 Average Risk   3.4   3.3  Average Risk       5.0   4.4  2 X Average Risk   9.6   7.1  3 X Average Risk  23.4   11.0        Use the calculated Patient Ratio above and the CHD Risk Table to determine the patient's CHD Risk.        ATP III CLASSIFICATION (LDL):  <100     mg/dL   Optimal  100-129  mg/dL   Near or Above                    Optimal  130-159  mg/dL   Borderline  160-189  mg/dL   High  >190     mg/dL   Very High Performed at [place] [address], [place], [place] [address]    POC Amphetamine UR 12/22/2021 None Detected  NONE DETECTED (Cut Off Level 1000 ng/mL) Final   POC Secobarbital (BAR) 12/22/2021 None Detected  NONE DETECTED (Cut Off Level 300 ng/mL) Final   POC Buprenorphine (BUP) 12/22/2021 None Detected  NONE DETECTED (Cut Off Level 10 ng/mL) Final   POC Oxazepam (BZO) 12/22/2021 None Detected  NONE DETECTED (Cut Off Level 300 ng/mL) Final   POC Cocaine UR 12/22/2021 None Detected  NONE DETECTED (Cut Off Level 300 ng/mL) Final   POC Methamphetamine UR 12/22/2021 None Detected  NONE  DETECTED (Cut Off Level 1000 ng/mL) Final   POC Morphine 12/22/2021 None Detected  NONE DETECTED (Cut Off Level 300 ng/mL) Final   POC Oxycodone UR 12/22/2021 None Detected  NONE DETECTED (Cut Off Level 100 ng/mL) Final   POC Methadone UR 12/22/2021 None Detected  NONE DETECTED (Cut Off Level 300 ng/mL) Final   POC Marijuana UR 12/22/2021 Positive (A)  NONE DETECTED (Cut Off Level 50 ng/mL) Final   SARS Coronavirus 2 Ag 12/22/2021 Negative  Negative Final   Preg Test, Ur 12/22/2021 NEGATIVE  NEGATIVE Final   Comment:        THE SENSITIVITY OF THIS METHODOLOGY IS >24 mIU/mL   Admission on 12/09/2021, Discharged on 12/09/2021  Component Date Value Ref Range Status   WBC 12/09/2021 3.1 (L)  4.0 - 10.5 K/uL Final   RBC 12/09/2021 4.23  3.87 - 5.11 MIL/uL Final   Hemoglobin 12/09/2021 14.3  12.0 - 15.0 g/dL Final   HCT 12/09/2021 43.3  36.0 - 46.0 % Final   MCV 12/09/2021 102.4 (H)  80.0 - 100.0 fL Final   MCH 12/09/2021 33.8  26.0 - 34.0 pg Final   MCHC 12/09/2021 33.0  30.0 - 36.0 g/dL Final   RDW 12/09/2021 14.5  11.5 - 15.5 % Final   Platelets 12/09/2021 263  150 - 400 K/uL Final   nRBC 12/09/2021 0.0  0.0 - 0.2 % Final   Neutrophils Relative % 12/09/2021 42  % Final   Neutro Abs 12/09/2021 1.3 (L)  1.7 - 7.7 K/uL Final   Lymphocytes Relative 12/09/2021 51  % Final   Lymphs Abs 12/09/2021 1.6  0.7 - 4.0 K/uL Final   Monocytes Relative 12/09/2021 6  % Final   Monocytes Absolute 12/09/2021 0.2  0.1 - 1.0 K/uL Final  Eosinophils Relative 12/09/2021 0  % Final   Eosinophils Absolute 12/09/2021 0.0  0.0 - 0.5 K/uL Final   Basophils Relative 12/09/2021 1  % Final   Basophils Absolute 12/09/2021 0.0  0.0 - 0.1 K/uL Final   Immature Granulocytes 12/09/2021 0  % Final   Abs Immature Granulocytes 12/09/2021 0.00  0.00 - 0.07 K/uL Final   Performed at [facility], [place] [address] [place], [place] [address]   Sodium 12/09/2021 144  135 - 145 mmol/L Final   Potassium  12/09/2021 3.8  3.5 - 5.1 mmol/L Final   Chloride 12/09/2021 107  98 - 111 mmol/L Final   CO2 12/09/2021 26  22 - 32 mmol/L Final   Glucose, Bld 12/09/2021 72  70 - 99 mg/dL Final   Glucose reference range applies only to samples taken after fasting for at least 8 hours.   BUN 12/09/2021 13  6 - 20 mg/dL Final   Creatinine, Ser 12/09/2021 0.49  0.44 - 1.00 mg/dL Final   Calcium 12/09/2021 8.7 (L)  8.9 - 10.3 mg/dL Final   Total Protein 12/09/2021 7.5  6.5 - 8.1 g/dL Final   Albumin 12/09/2021 3.8  3.5 - 5.0 g/dL Final   AST 12/09/2021 101 (H)  15 - 41 U/L Final   ALT 12/09/2021 41  0 - 44 U/L Final   Alkaline Phosphatase 12/09/2021 57  38 - 126 U/L Final   Total Bilirubin 12/09/2021 0.6  0.3 - 1.2 mg/dL Final   GFR, Estimated 12/09/2021 >60  >60 mL/min Final   Comment: (NOTE) Calculated using the CKD-EPI Creatinine Equation (2021)    Anion gap 12/09/2021 11  5 - 15 Final   Performed at [facility], [place] [address] [place], [place] [address]   Lipase 12/09/2021 31  11 - 51 U/L Final   Performed at [facility], [place] [address], [place], [place] [address]   Color, Urine 12/09/2021 YELLOW  YELLOW Final   APPearance 12/09/2021 HAZY (A)  CLEAR Final   Specific Gravity, Urine 12/09/2021 1.020  1.005 - 1.030 Final   pH 12/09/2021 7.0  5.0 - 8.0 Final   Glucose, UA 12/09/2021 NEGATIVE  NEGATIVE mg/dL Final   Hgb urine dipstick 12/09/2021 NEGATIVE  NEGATIVE Final   Bilirubin Urine 12/09/2021 NEGATIVE  NEGATIVE Final   Ketones, ur 12/09/2021 20 (A)  NEGATIVE mg/dL Final   Protein, ur 12/09/2021 30 (A)  NEGATIVE mg/dL Final   Nitrite 12/09/2021 NEGATIVE  NEGATIVE Final   Leukocytes,Ua 12/09/2021 TRACE (A)  NEGATIVE Final   RBC / HPF 12/09/2021 0-5  0 - 5 RBC/hpf Final   WBC, UA 12/09/2021 6-10  0 - 5 WBC/hpf Final   Bacteria, UA 12/09/2021 RARE (A)  NONE SEEN Final   Squamous Epithelial / LPF 12/09/2021 11-20  0 - 5 Final   Mucus 12/09/2021 PRESENT    Final   Performed at [facility], [place] [address] [place], [place] [address]   I-stat hCG, quantitative 12/09/2021 <5.0  <5 mIU/mL Final   Comment 3 12/09/2021          Final   Comment:   GEST. AGE      CONC.  (mIU/mL)   <=1 WEEK        5 - 50     2 WEEKS       50 - 500     3 WEEKS       100 - 10,000  4 WEEKS     1,000 - 30,000        FEMALE AND NON-PREGNANT FEMALE:     LESS THAN 5 mIU/mL    Alcohol, Ethyl (B) 12/09/2021 430 (HH)  <10 mg/dL Final   Comment: CRITICAL RESULT CALLED TO, READ BACK BY AND VERIFIED WITH: K.LIVINGSTON, EMT AT [id number] ON 01.27.23 BY N.THOMPSON. (NOTE) Lowest detectable limit for serum alcohol is 10 mg/dL.  For medical purposes only. Performed at [facility], [place] [address], [place], [place] [address]     Blood Alcohol level:  Lab Results  Component Value Date   ETH <10 12/22/2021   ETH 430 (HH) 123456    Metabolic Disorder Labs: Lab Results  Component Value Date   HGBA1C 4.9 12/22/2021   MPG 93.93 12/22/2021   No results found for: PROLACTIN Lab Results  Component Value Date   CHOL 253 (H) 12/22/2021   TRIG 91 12/22/2021   HDL 132 12/22/2021   CHOLHDL 1.9 12/22/2021   VLDL 18 12/22/2021   LDLCALC 103 (H) 12/22/2021    Therapeutic Lab Levels: No results found for: LITHIUM No results found for: VALPROATE No components found for:  CBMZ  Physical Findings   Flowsheet Row ED from 12/22/2021 in [facility] ED from 12/09/2021 in [facility] DEPT ED from 04/29/2021 in [facility] DEPT  C-SSRS RISK CATEGORY No Risk No Risk No Risk        Musculoskeletal  Strength & Muscle Tone: within normal limits Gait & Station: normal Patient leans: N/A  Psychiatric Specialty Exam  Presentation  General Appearance: Appropriate for Environment; Casual  Eye Contact:Fair  Speech:Clear and Coherent; Normal Rate  Speech  Volume:Normal  Handedness:No data recorded  Mood and Affect  Mood:-- ("fine")  Affect:Appropriate; Congruent   Thought Process  Thought Processes:Coherent; Goal Directed; Linear  Descriptions of Associations:Intact  Orientation:Full (Time, Place and Person)  Thought Content:WDL; Logical  Diagnosis of Schizophrenia or Schizoaffective disorder in past: No    Hallucinations:Hallucinations: None  Ideas of Reference:None  Suicidal Thoughts:Suicidal Thoughts: No  Homicidal Thoughts:Homicidal Thoughts: No   Sensorium  Memory:Immediate Good; Recent Fair; Remote Fair  Judgment:Fair  Insight:Fair   Executive Functions  Concentration:Good  Attention Span:Good  [place] of Knowledge:Good  Language:Good   Psychomotor Activity  Psychomotor Activity:Psychomotor Activity: Normal   Assets  Assets:Communication Skills; Desire for Improvement; Housing; Physical Health; Resilience; Social Support   Sleep  Sleep:Sleep: Fair   Nutritional Assessment (For OBS and FBC admissions only) Has the patient had a weight loss or gain of 10 pounds or more in the last 3 months?: No Has the patient had a decrease in food intake/or appetite?: Yes Does the patient have dental problems?: No Does the patient have eating habits or behaviors that may be indicators of an eating disorder including binging or inducing vomiting?: No Has the patient recently lost weight without trying?: 2.0 Has the patient been eating poorly because of a decreased appetite?: 1 Malnutrition Screening Tool Score: 3 Nutritional Assessment Referrals: Medication/Tx changes    Physical Exam  Physical Exam Constitutional:      Appearance: Normal appearance. She is normal weight.  HENT:     Head: Normocephalic and atraumatic.  Cardiovascular:     Rate and Rhythm: Normal rate and regular rhythm.     Heart sounds: Normal heart sounds.  Pulmonary:     Effort: Pulmonary effort is normal.     Breath  sounds: Normal breath  sounds.  Abdominal:     General: Bowel sounds are normal.     Palpations: Abdomen is soft.  Neurological:     Mental Status: She is alert and oriented to person, place, and time.   Review of Systems  Constitutional:  Negative for chills and fever.  HENT:  Negative for hearing loss.   Eyes:  Negative for discharge and redness.  Respiratory:  Negative for cough.   Cardiovascular:  Negative for chest pain.  Gastrointestinal:  Negative for abdominal pain.  Musculoskeletal:  Negative for myalgias.  Neurological:  Negative for headaches.  Psychiatric/Behavioral:  Positive for depression and substance abuse. Negative for hallucinations and suicidal ideas.   Blood pressure (!) 140/96, pulse 97, temperature (!) 97.5 F (36.4 C), temperature source Temporal, resp. rate 14, last menstrual period 11/25/2021, SpO2 100 %. There is no height or weight on file to calculate BMI.  Treatment Plan Summary: 26 year old female who presented to the [facility] behavioral health urgent care on 12/22/21 accompanied by her grandmother and a friend with chief complaint of "alcohol problem" and requesting substance use treatment.  Patient was admitted to the [facility] for alcohol detox and started on Ativan taper.  Patient tolerating Ativan taper well and is amenable to start Lexapro 10 mg daily for assistance with her mood.  Patient remains appropriate for continued treatment on the facility based crisis unit for alcohol detox.  [facility] was accepted to [facility] on Monday, 12/26/2021 for residential rehab-will need 14 day samples and 30 day scripts with 1 refill.  MDD vs SIMD -start lexapro 10 mg qhs  AUD, severe -continue CIWA protocol -continue ativan detox- 1 mg QID --> 1 mg TID--> 1 mg BID--> 1 mg daily -multivitamin -thiamine -PRNs for etoh withdrawal sx  HTN -continue home hydrochlorthiazide 12.5 mg daily  Hypokalemia -K 3.1 on admission - 4 doses of 40 m eq  ordered -repeat CMP  ordered 2/12-follow up  Dispo: [facility] for [facility] use treatment. Will need 14 day samples and 30 day scripts with 1 refill prior to discharge   Ival Bible, MD 12/23/2021 2:45 PM
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
## 2021-12-23 NOTE — Group Note (Signed)
Group Topic: Recovery Basics  Group Date: 12/23/2021 Start Time: 1010 End Time: 1040 Facilitators: Doyne Keel E  Department: [facility]  Number of Participants: 3  Group Focus: coping skills Treatment Modality:  Behavior Modification Therapy and Individual Therapy Interventions utilized were patient education and problem solving Purpose: enhance coping skills, relapse prevention strategies, and trigger / craving management  Name: Diane Dickson Date of Birth: [birth year]  MR: [id number]    Level of Participation: active Quality of Participation: attentive and cooperative Interactions with others: gave feedback Mood/Affect: appropriate Triggers (if applicable): n/a Cognition: coherent/clear Progress: Moderate Response: n/a Plan: follow-up needed  Patients Problems:  Patient Active Problem List   Diagnosis Date Noted   Alcohol use 12/22/2021   Alcohol abuse 01/19/2021   Hypertension 01/19/2021   Pneumomediastinum ([facility]) 01/03/2021

## 2021-12-23 NOTE — ED Notes (Signed)
Pt received resting in bed with eyes closed appearing to be asleep . Pt is easily aroused and voicing no complaints at this time , responding appropriately to questions asked , thought process is organized , denies SI / HI or AVH , pt is on detox status , last CIWA score =0 , will continue to monitor for safety .

## 2021-12-24 NOTE — ED Notes (Signed)
Breakfast was given 

## 2021-12-24 NOTE — ED Notes (Signed)
Pt sleeping@this time. breathing even and unlabored. Will continue to monitor for safety 

## 2021-12-24 NOTE — ED Notes (Signed)
Pt was given dinner, then she got on the phone

## 2021-12-24 NOTE — ED Notes (Signed)
Patient expressed to staff in wrap up group that she is looking forward to living a clean and sober life, once she is discharged from the hospital. She also expressed to staff that she loves animals, and that is her coping skill when she feels like she wants to drink. Patient also attended and interacted in [place] group.

## 2021-12-24 NOTE — ED Notes (Addendum)
Pt came to group and she was participated,snack was given

## 2021-12-24 NOTE — ED Notes (Signed)
Patient ate lunch, attended group and participated. Patient has been in the dayroom watching television. Patient has been calm and cooperative. Patient will continued to be monitored for safety.

## 2021-12-24 NOTE — ED Notes (Signed)
Lunch was given to pt ?

## 2021-12-24 NOTE — ED Provider Notes (Signed)
Behavioral Health Progress Note  Date and Time: 12/24/2021 2:47 PM Name: Diane Dickson MRN:  [id number]  Subjective:  Diane Dickson, 26 y.o., female patient seen face to face by this provider, chart reviewed and discussed with Dr. Caswell Corwin on 12/24/21. Patient initially presented to the [facility] behavioral health urgent care on 12/22/21 accompanied by her grandmother and a friend with chief complaint of "alcohol problem" and requesting substance use treatment.  Patient was admitted to the [facility] for alcohol detox and started on Ativan taper. She has been accepted [facility]21 for residential treatment. She denies any past history of alcohol withdrawal seizures or DT's.   On today's evaluation Diane Dickson is observed sitting in the dayroom watching TV and drinking coffee. She is alert/oriented x 4 and cooperative. She is pleasant. She makes good eye contact. Speech is at a moderate pace and tone. She endorses depression but states it is improved. She continues to endorse anxiety. She does not appear to be responding to internal/external stimuli. She denies AVH.Patient reports eating/sleeping without difficulty  and attending/participating in group sessions. She denies suicidal/homicidal/self-harm ideation, psychosis, and paranoia. Patients feelings and progress discussed.  Reassurance, support, and encouragement provided.   She is tolerating Ativan Taper, Lexapro and potassium supplement with out any adverse reactions.She denies any withdrawal symptoms. CMP will be reordered for 12/25/2021.   Diagnosis:  Final diagnoses:  Alcohol abuse  Substance induced mood disorder ([place])    Total Time spent with patient: 30 minutes  Past Psychiatric History: see h&p Past Medical History:  Past Medical History:  Diagnosis Date   Alcohol abuse    Bipolar 1 disorder ([place])    Pneumomediastinum ([place])    PTSD (post-traumatic stress disorder)    No past surgical history on  file. Family History:  Family History  Problem Relation Age of Onset   Healthy Mother    Family Psychiatric  History: See h&p Social History:  Social History   Substance and Sexual Activity  Alcohol Use Yes   Comment: daily     Social History   Substance and Sexual Activity  Drug Use Never    Social History   Socioeconomic History   Marital status: Single    Spouse name: Not on file   Number of children: Not on file   Years of education: Not on file   Highest education level: Not on file  Occupational History   Not on file  Tobacco Use   Smoking status: Never   Smokeless tobacco: Never  Vaping Use   Vaping Use: Some days   Substances: Nicotine  Substance and Sexual Activity   Alcohol use: Yes    Comment: daily   Drug use: Never   Sexual activity: Not on file  Other Topics Concern   Not on file  Social History Narrative   Not on file   Social Determinants of Health   Financial Resource Strain: Not on file  Food Insecurity: Not on file  Transportation Needs: Not on file  Physical Activity: Not on file  Stress: Not on file  Social Connections: Not on file   SDOH:  SDOH Screenings   Alcohol Screen: Not on file  Depression [id number]): Not on file  Financial Resource Strain: Not on file  Food Insecurity: Not on file  Housing: Not on file  Physical Activity: Not on file  Social Connections: Not on file  Stress: Not on file  Tobacco Use: Low Risk    Smoking  Tobacco Use: Never   Smokeless Tobacco Use: Never   Passive Exposure: Not on file  Transportation Needs: Not on file   Additional Social History:    Pain Medications: None reported Prescriptions: Patient denies Over the Counter: Patient denies History of alcohol / drug use?: Yes Longest period of sobriety (when/how long): During childhood; Patient endorsed excessive alcohol use since the age of 26yo. Negative Consequences of Use: Legal Withdrawal Symptoms: None Name of Substance 1: Alcohol  (ETOH) 1 - Age of First Use: 26yo 1 - Amount (size/oz): "A whole bottle" 1 - Frequency: Daily; "Every other day" 1 - Duration: 5 years; Since the age of 26yo 1 - Last Use / Amount: 5 shots of liquor 1 - Method of Aquiring: Purchase from a store 1- Route of Use: Oral                  Sleep: Fair  Appetite:  Good  Current Medications:  Current Facility-Administered Medications  Medication Dose Route Frequency Provider Last Rate Last Admin   acetaminophen (TYLENOL) tablet 650 mg  650 mg Oral Q6H PRN White, Patrice L, NP   650 mg at 12/22/21 1810   alum & mag hydroxide-simeth (MAALOX/MYLANTA) 200-200-20 MG/5ML suspension 30 mL  30 mL Oral Q4H PRN White, Patrice L, NP       escitalopram (LEXAPRO) tablet 10 mg  10 mg Oral Daily Ival Bible, MD   10 mg at 12/24/21 0935   hydrochlorothiazide (HYDRODIURIL) tablet 12.5 mg  12.5 mg Oral Daily White, Patrice L, NP   12.5 mg at 12/24/21 [id number]   hydrOXYzine (ATARAX) tablet 25 mg  25 mg Oral TID PRN White, Patrice L, NP       loperamide (IMODIUM) capsule 2-4 mg  2-4 mg Oral PRN White, Patrice L, NP       LORazepam (ATIVAN) tablet 1 mg  1 mg Oral Q6H PRN White, Patrice L, NP   1 mg at 12/24/21 0936   LORazepam (ATIVAN) tablet 1 mg  1 mg Oral TID White, Patrice L, NP   1 mg at 12/24/21 0940   Followed by   Derrill Memo ON 12/25/2021] LORazepam (ATIVAN) tablet 1 mg  1 mg Oral BID White, Patrice L, NP       Followed by   Derrill Memo ON 12/26/2021] LORazepam (ATIVAN) tablet 1 mg  1 mg Oral Daily White, Patrice L, NP       magnesium hydroxide (MILK OF MAGNESIA) suspension 30 mL  30 mL Oral Daily PRN White, Patrice L, NP       multivitamin with minerals tablet 1 tablet  1 tablet Oral Daily White, Patrice L, NP   1 tablet at 12/24/21 0936   ondansetron (ZOFRAN-ODT) disintegrating tablet 4 mg  4 mg Oral Q6H PRN White, Patrice L, NP       potassium chloride SA (KLOR-CON M) CR tablet 40 mEq  40 mEq Oral BID Ival Bible, MD   40 mEq at 12/24/21 0934    thiamine tablet 100 mg  100 mg Oral Daily White, Patrice L, NP   100 mg at 12/24/21 0934   traZODone (DESYREL) tablet 50 mg  50 mg Oral QHS PRN White, Patrice L, NP       No current outpatient medications on file.    Labs  Lab Results:  Admission on 12/22/2021  Component Date Value Ref Range Status   SARS Coronavirus 2 by RT PCR 12/22/2021 NEGATIVE  NEGATIVE Final   Comment: (NOTE) SARS-CoV-2  target nucleic acids are NOT DETECTED.  The SARS-CoV-2 RNA is generally detectable in upper respiratory specimens during the acute phase of infection. The lowest concentration of SARS-CoV-2 viral copies this assay can detect is 138 copies/mL. A negative result does not preclude SARS-Cov-2 infection and should not be used as the sole basis for treatment or other patient management decisions. A negative result may occur with  improper specimen collection/handling, submission of specimen other than nasopharyngeal swab, presence of viral mutation(s) within the areas targeted by this assay, and inadequate number of viral copies(<138 copies/mL). A negative result must be combined with clinical observations, patient history, and epidemiological information. The expected result is Negative.  Fact Sheet for Patients:  EntrepreneurPulse.com.au  Fact Sheet for Healthcare Providers:  IncredibleEmployment.be  This test is no                          t yet approved or cleared by the [place] FDA and  has been authorized for detection and/or diagnosis of SARS-CoV-2 by FDA under an Emergency Use Authorization (EUA). This EUA will remain  in effect (meaning this test can be used) for the duration of the COVID-19 declaration under Section 564(b)(1) of the Act, 21 U.S.C.section 360bbb-3(b)(1), unless the authorization is terminated  or revoked sooner.       Influenza A by PCR 12/22/2021 NEGATIVE  NEGATIVE Final   Influenza B by PCR 12/22/2021 NEGATIVE   NEGATIVE Final   Comment: (NOTE) The Xpert Xpress SARS-CoV-2/FLU/RSV plus assay is intended as an aid in the diagnosis of influenza from Nasopharyngeal swab specimens and should not be used as a sole basis for treatment. Nasal washings and aspirates are unacceptable for Xpert Xpress SARS-CoV-2/FLU/RSV testing.  Fact Sheet for Patients: EntrepreneurPulse.com.au  Fact Sheet for Healthcare Providers: IncredibleEmployment.be  This test is not yet approved or cleared by the [place] FDA and has been authorized for detection and/or diagnosis of SARS-CoV-2 by FDA under an Emergency Use Authorization (EUA). This EUA will remain in effect (meaning this test can be used) for the duration of the COVID-19 declaration under Section 564(b)(1) of the Act, 21 U.S.C. section 360bbb-3(b)(1), unless the authorization is terminated or revoked.  Performed at [facility], [place] [address], [place], [place] [address]    WBC 12/22/2021 4.4  4.0 - 10.5 K/uL Final   RBC 12/22/2021 3.94  3.87 - 5.11 MIL/uL Final   Hemoglobin 12/22/2021 13.4  12.0 - 15.0 g/dL Final   HCT 12/22/2021 40.2  36.0 - 46.0 % Final   MCV 12/22/2021 102.0 (H)  80.0 - 100.0 fL Final   MCH 12/22/2021 34.0  26.0 - 34.0 pg Final   MCHC 12/22/2021 33.3  30.0 - 36.0 g/dL Final   RDW 12/22/2021 13.3  11.5 - 15.5 % Final   Platelets 12/22/2021 304  150 - 400 K/uL Final   nRBC 12/22/2021 0.0  0.0 - 0.2 % Final   Neutrophils Relative % 12/22/2021 61  % Final   Neutro Abs 12/22/2021 2.7  1.7 - 7.7 K/uL Final   Lymphocytes Relative 12/22/2021 29  % Final   Lymphs Abs 12/22/2021 1.3  0.7 - 4.0 K/uL Final   Monocytes Relative 12/22/2021 8  % Final   Monocytes Absolute 12/22/2021 0.3  0.1 - 1.0 K/uL Final   Eosinophils Relative 12/22/2021 1  % Final   Eosinophils Absolute 12/22/2021 0.0  0.0 - 0.5 K/uL Final   Basophils Relative 12/22/2021 1  %  Final   Basophils Absolute 12/22/2021 0.0  0.0 - 0.1  K/uL Final   Immature Granulocytes 12/22/2021 0  % Final   Abs Immature Granulocytes 12/22/2021 0.01  0.00 - 0.07 K/uL Final   Performed at [facility], [place] [address] [place], [place] [address]   Sodium 12/22/2021 137  135 - 145 mmol/L Final   Potassium 12/22/2021 3.1 (L)  3.5 - 5.1 mmol/L Final   Chloride 12/22/2021 97 (L)  98 - 111 mmol/L Final   CO2 12/22/2021 27  22 - 32 mmol/L Final   Glucose, Bld 12/22/2021 78  70 - 99 mg/dL Final   Glucose reference range applies only to samples taken after fasting for at least 8 hours.   BUN 12/22/2021 5 (L)  6 - 20 mg/dL Final   Creatinine, Ser 12/22/2021 0.63  0.44 - 1.00 mg/dL Final   Calcium 12/22/2021 9.2  8.9 - 10.3 mg/dL Final   Total Protein 12/22/2021 7.3  6.5 - 8.1 g/dL Final   Albumin 12/22/2021 3.8  3.5 - 5.0 g/dL Final   AST 12/22/2021 46 (H)  15 - 41 U/L Final   ALT 12/22/2021 35  0 - 44 U/L Final   Alkaline Phosphatase 12/22/2021 71  38 - 126 U/L Final   Total Bilirubin 12/22/2021 0.6  0.3 - 1.2 mg/dL Final   GFR, Estimated 12/22/2021 >60  >60 mL/min Final   Comment: (NOTE) Calculated using the CKD-EPI Creatinine Equation (2021)    Anion gap 12/22/2021 13  5 - 15 Final   Performed at [facility], [place] [address] [place], [place] [address]   Hgb A1c MFr Bld 12/22/2021 4.9  4.8 - 5.6 % Final   Comment: (NOTE) Pre diabetes:          5.7%-6.4%  Diabetes:              >6.4%  Glycemic control for   <7.0% adults with diabetes    Mean Plasma Glucose 12/22/2021 93.93  mg/dL Final   Performed at [facility], [place] [address] [place], [place] [address]   Alcohol, Ethyl (B) 12/22/2021 <10  <10 mg/dL Final   Comment: (NOTE) Lowest detectable limit for serum alcohol is 10 mg/dL.  For medical purposes only. Performed at [facility], [place] [address] [place], [place] [address]    TSH 12/22/2021 1.816  0.350 - 4.500 uIU/mL Final   Comment: Performed by a 3rd Generation assay with a functional sensitivity  of <=0.01 uIU/mL. Performed at [facility], [place] [address], [place], [place] [address]    Cholesterol 12/22/2021 253 (H)  0 - 200 mg/dL Final   Triglycerides 12/22/2021 91  <150 mg/dL Final   HDL 12/22/2021 132  >40 mg/dL Final   Total CHOL/HDL Ratio 12/22/2021 1.9  RATIO Final   VLDL 12/22/2021 18  0 - 40 mg/dL Final   LDL Cholesterol 12/22/2021 103 (H)  0 - 99 mg/dL Final   Comment:        Total Cholesterol/HDL:CHD Risk Coronary Heart Disease Risk Table                     Men   Women  1/2 Average Risk   3.4   3.3  Average Risk       5.0   4.4  2 X Average Risk   9.6   7.1  3 X Average Risk  23.4   11.0  Use the calculated Patient Ratio above and the CHD Risk Table to determine the patient's CHD Risk.        ATP III CLASSIFICATION (LDL):  <100     mg/dL   Optimal  100-129  mg/dL   Near or Above                    Optimal  130-159  mg/dL   Borderline  160-189  mg/dL   High  >190     mg/dL   Very High Performed at [place] [address], [place], [place] [address]    POC Amphetamine UR 12/22/2021 None Detected  NONE DETECTED (Cut Off Level 1000 ng/mL) Final   POC Secobarbital (BAR) 12/22/2021 None Detected  NONE DETECTED (Cut Off Level 300 ng/mL) Final   POC Buprenorphine (BUP) 12/22/2021 None Detected  NONE DETECTED (Cut Off Level 10 ng/mL) Final   POC Oxazepam (BZO) 12/22/2021 None Detected  NONE DETECTED (Cut Off Level 300 ng/mL) Final   POC Cocaine UR 12/22/2021 None Detected  NONE DETECTED (Cut Off Level 300 ng/mL) Final   POC Methamphetamine UR 12/22/2021 None Detected  NONE DETECTED (Cut Off Level 1000 ng/mL) Final   POC Morphine 12/22/2021 None Detected  NONE DETECTED (Cut Off Level 300 ng/mL) Final   POC Oxycodone UR 12/22/2021 None Detected  NONE DETECTED (Cut Off Level 100 ng/mL) Final   POC Methadone UR 12/22/2021 None Detected  NONE DETECTED (Cut Off Level 300 ng/mL) Final   POC Marijuana UR 12/22/2021 Positive (A)  NONE DETECTED (Cut Off  Level 50 ng/mL) Final   SARS Coronavirus 2 Ag 12/22/2021 Negative  Negative Final   Preg Test, Ur 12/22/2021 NEGATIVE  NEGATIVE Final   Comment:        THE SENSITIVITY OF THIS METHODOLOGY IS >24 mIU/mL   Admission on 12/09/2021, Discharged on 12/09/2021  Component Date Value Ref Range Status   WBC 12/09/2021 3.1 (L)  4.0 - 10.5 K/uL Final   RBC 12/09/2021 4.23  3.87 - 5.11 MIL/uL Final   Hemoglobin 12/09/2021 14.3  12.0 - 15.0 g/dL Final   HCT 12/09/2021 43.3  36.0 - 46.0 % Final   MCV 12/09/2021 102.4 (H)  80.0 - 100.0 fL Final   MCH 12/09/2021 33.8  26.0 - 34.0 pg Final   MCHC 12/09/2021 33.0  30.0 - 36.0 g/dL Final   RDW 12/09/2021 14.5  11.5 - 15.5 % Final   Platelets 12/09/2021 263  150 - 400 K/uL Final   nRBC 12/09/2021 0.0  0.0 - 0.2 % Final   Neutrophils Relative % 12/09/2021 42  % Final   Neutro Abs 12/09/2021 1.3 (L)  1.7 - 7.7 K/uL Final   Lymphocytes Relative 12/09/2021 51  % Final   Lymphs Abs 12/09/2021 1.6  0.7 - 4.0 K/uL Final   Monocytes Relative 12/09/2021 6  % Final   Monocytes Absolute 12/09/2021 0.2  0.1 - 1.0 K/uL Final   Eosinophils Relative 12/09/2021 0  % Final   Eosinophils Absolute 12/09/2021 0.0  0.0 - 0.5 K/uL Final   Basophils Relative 12/09/2021 1  % Final   Basophils Absolute 12/09/2021 0.0  0.0 - 0.1 K/uL Final   Immature Granulocytes 12/09/2021 0  % Final   Abs Immature Granulocytes 12/09/2021 0.00  0.00 - 0.07 K/uL Final   Performed at [facility], [place] [address] [place], [place] [address]   Sodium 12/09/2021 144  135 - 145 mmol/L Final   Potassium  12/09/2021 3.8  3.5 - 5.1 mmol/L Final   Chloride 12/09/2021 107  98 - 111 mmol/L Final   CO2 12/09/2021 26  22 - 32 mmol/L Final   Glucose, Bld 12/09/2021 72  70 - 99 mg/dL Final   Glucose reference range applies only to samples taken after fasting for at least 8 hours.   BUN 12/09/2021 13  6 - 20 mg/dL Final   Creatinine, Ser 12/09/2021 0.49  0.44 - 1.00 mg/dL Final   Calcium  12/09/2021 8.7 (L)  8.9 - 10.3 mg/dL Final   Total Protein 12/09/2021 7.5  6.5 - 8.1 g/dL Final   Albumin 12/09/2021 3.8  3.5 - 5.0 g/dL Final   AST 12/09/2021 101 (H)  15 - 41 U/L Final   ALT 12/09/2021 41  0 - 44 U/L Final   Alkaline Phosphatase 12/09/2021 57  38 - 126 U/L Final   Total Bilirubin 12/09/2021 0.6  0.3 - 1.2 mg/dL Final   GFR, Estimated 12/09/2021 >60  >60 mL/min Final   Comment: (NOTE) Calculated using the CKD-EPI Creatinine Equation (2021)    Anion gap 12/09/2021 11  5 - 15 Final   Performed at [facility], [place] [address] [place], [place] [address]   Lipase 12/09/2021 31  11 - 51 U/L Final   Performed at [facility] [place] [address], [place], [place] [address]   Color, Urine 12/09/2021 YELLOW  YELLOW Final   APPearance 12/09/2021 HAZY (A)  CLEAR Final   Specific Gravity, Urine 12/09/2021 1.020  1.005 - 1.030 Final   pH 12/09/2021 7.0  5.0 - 8.0 Final   Glucose, UA 12/09/2021 NEGATIVE  NEGATIVE mg/dL Final   Hgb urine dipstick 12/09/2021 NEGATIVE  NEGATIVE Final   Bilirubin Urine 12/09/2021 NEGATIVE  NEGATIVE Final   Ketones, ur 12/09/2021 20 (A)  NEGATIVE mg/dL Final   Protein, ur 12/09/2021 30 (A)  NEGATIVE mg/dL Final   Nitrite 12/09/2021 NEGATIVE  NEGATIVE Final   Leukocytes,Ua 12/09/2021 TRACE (A)  NEGATIVE Final   RBC / HPF 12/09/2021 0-5  0 - 5 RBC/hpf Final   WBC, UA 12/09/2021 6-10  0 - 5 WBC/hpf Final   Bacteria, UA 12/09/2021 RARE (A)  NONE SEEN Final   Squamous Epithelial / LPF 12/09/2021 11-20  0 - 5 Final   Mucus 12/09/2021 PRESENT   Final   Performed at [facility], [place] [address] [place], [place] [address]   I-stat hCG, quantitative 12/09/2021 <5.0  <5 mIU/mL Final   Comment 3 12/09/2021          Final   Comment:   GEST. AGE      CONC.  (mIU/mL)   <=1 WEEK        5 - 50     2 WEEKS       50 - 500     3 WEEKS       100 - 10,000     4 WEEKS     1,000 - 30,000        FEMALE AND  NON-PREGNANT FEMALE:     LESS THAN 5 mIU/mL    Alcohol, Ethyl (B) 12/09/2021 430 (HH)  <10 mg/dL Final   Comment: CRITICAL RESULT CALLED TO, READ BACK BY AND VERIFIED WITH: K.LIVINGSTON, EMT AT [id number] ON 01.27.23 BY N.THOMPSON. (NOTE) Lowest detectable limit for serum alcohol is 10 mg/dL.  For medical purposes only. Performed at [facility], [place] [address] [place],  [address]  Blood Alcohol level:  Lab Results  Component Value Date   ETH <10 12/22/2021   ETH 430 (HH) 123456    Metabolic Disorder Labs: Lab Results  Component Value Date   HGBA1C 4.9 12/22/2021   MPG 93.93 12/22/2021   No results found for: PROLACTIN Lab Results  Component Value Date   CHOL 253 (H) 12/22/2021   TRIG 91 12/22/2021   HDL 132 12/22/2021   CHOLHDL 1.9 12/22/2021   VLDL 18 12/22/2021   LDLCALC 103 (H) 12/22/2021    Therapeutic Lab Levels: No results found for: LITHIUM No results found for: VALPROATE No components found for:  CBMZ  Physical Findings   Flowsheet Row ED from [facility]22[facility]2021 in [facility] from 12/09/2021 in [facility] DEPT ED from 04/29/2021 in [facility] DEPT  C-SSRS RISK CATEGORY No Risk No Risk No Risk        Musculoskeletal  Strength & Muscle Tone: within normal limits Gait & Station: normal Patient leans: N/A  Psychiatric Specialty Exam  Presentation  General Appearance: Appropriate for Environment; Casual  Eye Contact:Good  Speech:Clear and Coherent; Normal Rate  Speech Volume:Normal  Handedness:Right   Mood and Affect  Mood:Anxious  Affect:Congruent   Thought Process  Thought Processes:Coherent  Descriptions of Associations:Intact  Orientation:Full (Time, Place and Person)  Thought Content:Logical  Diagnosis of Schizophrenia or Schizoaffective disorder in past: No    Hallucinations:Hallucinations: None  Ideas of  Reference:None  Suicidal Thoughts:Suicidal Thoughts: No  Homicidal Thoughts:Homicidal Thoughts: No   Sensorium  Memory:Immediate Good; Recent Good; Remote Good  Judgment:Good  Insight:Good   Executive Functions  Concentration:Good  Attention Span:Good  [place] of Knowledge:Good  Language:Good   Psychomotor Activity  Psychomotor Activity:Psychomotor Activity: Normal   Assets  Assets:Communication Skills; Desire for Improvement; Financial Resources/Insurance; Physical Health; Resilience   Sleep  Sleep:Sleep: Fair   No data recorded  Physical Exam  Physical Exam Vitals and nursing note reviewed.  Constitutional:      General: She is not in acute distress.    Appearance: Normal appearance. She is not ill-appearing.  HENT:     Head: Normocephalic.  Eyes:     General:        Right eye: No discharge.        Left eye: No discharge.     Conjunctiva/sclera: Conjunctivae normal.     Pupils: Pupils are equal, round, and reactive to light.  Cardiovascular:     Rate and Rhythm: Normal rate.  Pulmonary:     Effort: Pulmonary effort is normal.  Musculoskeletal:        General: Normal range of motion.     Cervical back: Normal range of motion.  Skin:    General: Skin is warm and dry.     Coloration: Skin is not jaundiced or pale.  Neurological:     Mental Status: She is alert and oriented to person, place, and time.  Psychiatric:        Attention and Perception: Attention and perception normal.        Mood and Affect: Affect normal. Mood is anxious.        Speech: Speech normal.        Behavior: Behavior normal. Behavior is cooperative.        Thought Content: Thought content normal.        Cognition and Memory: Cognition normal.        Judgment: Judgment is impulsive.   Review of Systems  Constitutional: Negative.   HENT: Negative.    Eyes: Negative.   Respiratory: Negative.    Cardiovascular: Negative.   Musculoskeletal: Negative.   Skin:  Negative.   Neurological: Negative.   Psychiatric/Behavioral:  The patient is nervous/anxious.   Blood pressure (!) 144/98, pulse 97, temperature (!) 97.5 F (36.4 C), temperature source Oral, resp. rate 16, last menstrual period 11/25/2021, SpO2 100 %. There is no height or weight on file to calculate BMI.  Treatment Plan Summary:  Dis[facility]: [facility] for substance use treatment. Will need 14 day samples and 30 day scripts with 1 refill prior to discharge  repeat CMP ordered 2/12-follow up (to recheck K+)  Daily contact with patient to assess and evaluate symptoms and progress in treatment and Medication management   Revonda Humphrey, NP 12/24/2021 2:47 PM

## 2021-12-24 NOTE — ED Notes (Signed)
Patient denies SI/HI and AVH. Patient is in a good mood. Patient came to breakfast this morning and interacted with staff and other patients. Writer confirmed that patient is scheduled to be admitted to [facility] on 12/26/2021 by staff person at [facility].

## 2021-12-24 NOTE — ED Notes (Signed)
Pt talking on phone she is calm and cooperative. No c/o pain or distress. Will continue to monitor for safety

## 2021-12-24 NOTE — ED Notes (Addendum)
Patient received her evening medications and ate dinner. Patient has been in good spirits, interacting with staff and other patients on the unit. Patient has spent the majority of the day in the dayroom watching television. Patient will continued to be monitored for safety.

## 2021-12-24 NOTE — ED Notes (Signed)
Appears to be resting quietly at this time , eyes closed , respirations even and unlabored , no distress noted . Will continue to monitor for safety  °

## 2021-12-25 LAB — COMPREHENSIVE METABOLIC PANEL
ALT: 26 U/L (ref 0–44)
AST: 29 U/L (ref 15–41)
Albumin: 3.9 g/dL (ref 3.5–5.0)
Alkaline Phosphatase: 55 U/L (ref 38–126)
Anion gap: 9 (ref 5–15)
BUN: <5 mg/dL — ABNORMAL LOW (ref 6–20)
CO2: 30 mmol/L (ref 22–32)
Calcium: 9.5 mg/dL (ref 8.9–10.3)
Chloride: 97 mmol/L — ABNORMAL LOW (ref 98–111)
Creatinine, Ser: 0.59 mg/dL (ref 0.44–1.00)
GFR, Estimated: >60 mL/min (ref >60)
Glucose, Bld: 79 mg/dL (ref 70–99)
Potassium: 4.2 mmol/L (ref 3.5–5.1)
Sodium: 136 mmol/L (ref 135–145)
Total Bilirubin: 0.4 mg/dL (ref 0.3–1.2)
Total Protein: 7.8 g/dL (ref 6.5–8.1)

## 2021-12-25 MED ORDER — HYDROCHLOROTHIAZIDE 12.5 MG PO TABS: 12.5000 mg | ORAL_TABLET | Freq: Every day | ORAL | 0 refills | Status: DC

## 2021-12-25 MED ORDER — HYDROCHLOROTHIAZIDE 12.5 MG PO TABS: 12.5000 mg | ORAL_TABLET | Freq: Every day | ORAL | 1 refills | Status: DC

## 2021-12-25 MED ORDER — ESCITALOPRAM OXALATE 10 MG PO TABS: 10.0000 mg | ORAL_TABLET | Freq: Every day | ORAL | 1 refills | Status: DC

## 2021-12-25 MED ORDER — HYDROXYZINE HCL 25 MG PO TABS: 25.0000 mg | ORAL_TABLET | Freq: Three times a day (TID) | ORAL | 1 refills | Status: AC | PRN

## 2021-12-25 MED ORDER — HYDROXYZINE HCL 25 MG PO TABS: 25.0000 mg | ORAL_TABLET | Freq: Three times a day (TID) | ORAL | 1 refills | Status: DC | PRN

## 2021-12-25 MED ORDER — HYDROXYZINE PAMOATE 25 MG PO CAPS
25.0000 mg | ORAL_CAPSULE | Freq: Three times a day (TID) | ORAL | 0 refills | Status: DC | PRN
Start: 2021-12-25 — End: 2021-12-25

## 2021-12-25 MED ORDER — TRAZODONE HCL 50 MG PO TABS
50.0000 mg | ORAL_TABLET | Freq: Every evening | ORAL | 1 refills | Status: DC | PRN
Start: 2021-12-25 — End: 2021-12-25

## 2021-12-25 MED ORDER — ESCITALOPRAM OXALATE 10 MG PO TABS: 10.0000 mg | ORAL_TABLET | Freq: Every day | ORAL | 0 refills | Status: DC

## 2021-12-25 MED ORDER — TRAZODONE HCL 50 MG PO TABS: 50.0000 mg | ORAL_TABLET | Freq: Every evening | ORAL | 1 refills | Status: AC | PRN

## 2021-12-25 MED ORDER — HYDROCHLOROTHIAZIDE 12.5 MG PO TABS
12.5000 mg | ORAL_TABLET | Freq: Every day | ORAL | 0 refills | Status: DC
Start: 2021-12-25 — End: 2021-12-25

## 2021-12-25 MED ORDER — TRAZODONE HCL 50 MG PO TABS: 50.0000 mg | ORAL_TABLET | Freq: Every day | ORAL | 0 refills | Status: DC

## 2021-12-25 NOTE — ED Provider Notes (Signed)
FBC/OBS ASAP Discharge Summary  Date and Time: 12/25/2021 3:36 PM  Name: Diane Dickson  MRN:  [id number]   Discharge Diagnoses:  Final diagnoses:  Alcohol abuse  Substance induced mood disorder (HCC)    Subjective: Diane Dickson, 26 y.o., female patient seen face to face by this provider, chart reviewed and discussed with Dr. Sherron Flemings on 12/25/21. Patient initially presented to the [facility] behavioral health urgent care on 12/22/21 accompanied by her grandmother and a friend with chief complaint of "alcohol problem" and requesting substance use treatment.  Patient was admitted to the [facility] for alcohol detox and started on Ativan taper. She has been accepted [facility] on Monday 12/[facility]/2021 for residential treatment. She denies any past history of alcohol withdrawal seizures or DT's.   On today's reassessment, patient is alert/oriented x 4. She is cooperative. She is speaking at a moderate pace and tone with good eye contact. She is euthymic with a congruent affect. She endorses some anxiety due to going to [facility] [employer]. She deny's depression. She Denies SI/HI/AVH. She deny's any concerns with appetite or sleep.  She continues to tolerate medications with out any adverse reactions. She deny's any alcohol withdrawal symptoms.   CMP completed to recheck potassium, Level is 4.2     Stay Summary:   Patient has been calm and cooperative on the unit. She has interacted appropriately with staff and other patients. She has exhibited no unsafe behaviors. She has been accepted for [facility] on 12/26/2021. Per [facility] requirement she will be discharged with 14 day sample of medications including one month refill. Medications prescribed are Lexapro 10 mg QD, Hydrochlorothiazide 12.5 mg QD, trazodone 50 mg QHS PRN for sleep, Hydroxyzine 25 mg PO PRN TID for anxiety.   Upon completion of this admission the Diane Dickson was both mentally and medically stable for discharge denying  suicidal/homicidal ideation, auditory/visual/tactile hallucinations, delusional thoughts and paranoia.     Total Time spent with patient: 30 minutes  Past Psychiatric History: see h&p Past Medical History:  Past Medical History:  Diagnosis Date   Alcohol abuse    Bipolar 1 disorder (HCC)    Pneumomediastinum (HCC)    PTSD (post-traumatic stress disorder)    No past surgical history on file. Family History:  Family History  Problem Relation Age of Onset   Healthy Mother    Family Psychiatric History: see h&p Social History:  Social History   Substance and Sexual Activity  Alcohol Use Yes   Comment: daily     Social History   Substance and Sexual Activity  Drug Use Never    Social History   Socioeconomic History   Marital status: Single    Spouse name: Not on file   Number of children: Not on file   Years of education: Not on file   Highest education level: Not on file  Occupational History   Not on file  Tobacco Use   Smoking status: Never   Smokeless tobacco: Never  Vaping Use   Vaping Use: Some days   Substances: Nicotine  Substance and Sexual Activity   Alcohol use: Yes    Comment: daily   Drug use: Never   Sexual activity: Not on file  Other Topics Concern   Not on file  Social History Narrative   Not on file   Social Determinants of Health   Financial Resource Strain: Not on file  Food Insecurity: Not on file  Transportation Needs: Not on file  Physical Activity: Not on file  Stress: Not on file  Social Connections: Not on file   SDOH:  SDOH Screenings   Alcohol Screen: Not on file  Depression (PHQ2-9): Not on file  Financial Resource Strain: Not on file  Food Insecurity: Not on file  Housing: Not on file  Physical Activity: Not on file  Social Connections: Not on file  Stress: Not on file  Tobacco Use: Low Risk    Smoking Tobacco Use: Never   Smokeless Tobacco Use: Never   Passive Exposure: Not on file  Transportation Needs: Not on  file    Tobacco Cessation:  N/A, patient does not currently use tobacco products  Current Medications:  Current Facility-Administered Medications  Medication Dose Route Frequency Provider Last Rate Last Admin   acetaminophen (TYLENOL) tablet 650 mg  650 mg Oral Q6H PRN White, Patrice L, NP   650 mg at 12/22/21 1810   alum & mag hydroxide-simeth (MAALOX/MYLANTA) 200-200-20 MG/5ML suspension 30 mL  30 mL Oral Q4H PRN White, Patrice L, NP       escitalopram (LEXAPRO) tablet 10 mg  10 mg Oral Daily Estella Husk, MD   10 mg at 12/25/21 3818   hydrochlorothiazide (HYDRODIURIL) tablet 12.5 mg  12.5 mg Oral Daily White, Patrice L, NP   12.5 mg at 12/25/21 2993   hydrOXYzine (ATARAX) tablet 25 mg  25 mg Oral TID PRN White, Patrice L, NP       loperamide (IMODIUM) capsule 2-4 mg  2-4 mg Oral PRN White, Patrice L, NP       LORazepam (ATIVAN) tablet 1 mg  1 mg Oral Q6H PRN White, Patrice L, NP   1 mg at 12/24/21 0936   LORazepam (ATIVAN) tablet 1 mg  1 mg Oral BID White, Patrice L, NP   1 mg at 12/25/21 7169   Followed by   Melene Muller ON 12/26/2021] LORazepam (ATIVAN) tablet 1 mg  1 mg Oral Daily White, Patrice L, NP       magnesium hydroxide (MILK OF MAGNESIA) suspension 30 mL  30 mL Oral Daily PRN White, Patrice L, NP       multivitamin with minerals tablet 1 tablet  1 tablet Oral Daily White, Patrice L, NP   1 tablet at 12/25/21 0951   ondansetron (ZOFRAN-ODT) disintegrating tablet 4 mg  4 mg Oral Q6H PRN White, Patrice L, NP       thiamine tablet 100 mg  100 mg Oral Daily White, Patrice L, NP   100 mg at 12/25/21 0950   traZODone (DESYREL) tablet 50 mg  50 mg Oral QHS PRN White, Patrice L, NP   50 mg at 12/24/21 2111   Current Outpatient Medications  Medication Sig Dispense Refill   escitalopram (LEXAPRO) 10 MG tablet Take 1 tablet (10 mg total) by mouth daily. 30 tablet 1   hydrochlorothiazide (HYDRODIURIL) 12.5 MG tablet Take 1 tablet (12.5 mg total) by mouth daily. 30 tablet 1   hydrOXYzine  (ATARAX) 25 MG tablet Take 1 tablet (25 mg total) by mouth 3 (three) times daily as needed for anxiety. 90 tablet 1   traZODone (DESYREL) 50 MG tablet Take 1 tablet (50 mg total) by mouth at bedtime as needed for sleep. 30 tablet 1    PTA Medications: (Not in a hospital admission)   Musculoskeletal  Strength & Muscle Tone: within normal limits Gait & Station: normal Patient leans: N/A  Psychiatric Specialty Exam  Presentation  General Appearance: Appropriate for Environment  Eye Contact:Good  Speech:Clear and Coherent; Normal Rate  Speech Volume:Normal  Handedness:Right   Mood and Affect  Mood:Anxious  Affect:Congruent   Thought Process  Thought Processes:Coherent  Descriptions of Associations:Intact  Orientation:Full (Time, Place and Person)  Thought Content:Logical  Diagnosis of Schizophrenia or Schizoaffective disorder in past: No    Hallucinations:Hallucinations: None  Ideas of Reference:None  Suicidal Thoughts:Suicidal Thoughts: No  Homicidal Thoughts:Homicidal Thoughts: No   Sensorium  Memory:Immediate Good; Recent Good; Remote Good  Judgment:Good  Insight:Good   Executive Functions  Concentration:Good  Attention Span:Good  Recall:Good  Fund of Knowledge:Good  Language:Good   Psychomotor Activity  Psychomotor Activity:Psychomotor Activity: Normal   Assets  Assets:Communication Skills; Desire for Improvement; Physical Health; Resilience; Social Support; Financial Resources/Insurance   Sleep  Sleep:Sleep: Good   No data recorded  Physical Exam  Physical Exam Vitals and nursing note reviewed.  Constitutional:      General: She is not in acute distress.    Appearance: Normal appearance. She is not ill-appearing.  HENT:     Head: Normocephalic.  Eyes:     General:        Right eye: No discharge.        Left eye: No discharge.     Conjunctiva/sclera: Conjunctivae normal.  Cardiovascular:     Rate and Rhythm: Normal  rate.  Pulmonary:     Effort: Pulmonary effort is normal. No respiratory distress.  Musculoskeletal:        General: Normal range of motion.     Cervical back: Normal range of motion.  Skin:    Coloration: Skin is not jaundiced or pale.  Neurological:     Mental Status: She is alert and oriented to person, place, and time.  Psychiatric:        Attention and Perception: Attention and perception normal.        Mood and Affect: Mood and affect normal.        Speech: Speech normal.        Behavior: Behavior normal. Behavior is cooperative.        Thought Content: Thought content normal.        Cognition and Memory: Cognition normal.        Judgment: Judgment normal.   Review of Systems  Constitutional: Negative.   HENT: Negative.    Eyes: Negative.   Respiratory: Negative.    Cardiovascular: Negative.   Musculoskeletal: Negative.   Skin: Negative.   Neurological: Negative.   Psychiatric/Behavioral: Negative.    Blood pressure (!) 125/95, pulse 85, temperature 97.7 F (36.5 C), temperature source Oral, resp. rate 18, last menstrual period 11/25/2021, SpO2 100 %. There is no height or weight on file to calculate BMI.  Demographic Factors:  Adolescent or young adult, Low socioeconomic status, and Unemployed  Loss Factors: Financial problems/change in socioeconomic status  Historical Factors: Impulsivity  Risk Reduction Factors:   Sense of responsibility to family, Positive social support, Positive therapeutic relationship, and Positive coping skills or problem solving skills  Continued Clinical Symptoms:  Severe Anxiety and/or Agitation Depression:   Comorbid alcohol abuse/dependence Impulsivity Alcohol/Substance Abuse/Dependencies  Cognitive Features That Contribute To Risk:  None    Suicide Risk:  Minimal: No identifiable suicidal ideation.  Patients presenting with no risk factors but with morbid ruminations; may be classified as minimal risk based on the severity of  the depressive symptoms  Plan Of Care/Follow-up recommendations:  Activity:  as tolerated  Diet:  regular  Disposition:   Discharge patient  She has [facility]. Per [facility] requirement she will be discharged with 14 day sample of medications including one month refill. Medications prescribed are Lexapro 10 mg QD, Hydrochlorothiazide 12.5 mg QD, trazodone 50 mg QHS PRN for sleep, Hydroxyzine 25 mg PO PRN TID for anxiety.   Ardis Hughsarolyn H Tatsuya Okray, NP 12/25/2021, 3:36 PM

## 2021-12-25 NOTE — ED Notes (Signed)
Patient is asleep in her room. Patient has been calm and cooperative throughout the shift and reported she looks forward to going to [facility] in the morning. Patient is being monitored by staff for safety.

## 2021-12-25 NOTE — ED Notes (Signed)
Patient has been in the dayroom most of the shift, watching television and interacting with another patient. Patient denies SI/HI and AVH. Patient has been calm and cooperative throughout the shift. Patient is being monitored for safety.

## 2021-12-25 NOTE — ED Notes (Signed)
Pt is in the bed sleeping. Respirations are even and unlabored. No acute distress noted. Will continue to monitor for safety. 

## 2021-12-25 NOTE — ED Notes (Signed)
Patient denies SI/HI and AVH. Patient is calm and cooperative on the unit. Patient attended group, took her medications and ate breakfast. Patient is being monitored for safety.

## 2021-12-25 NOTE — ED Notes (Signed)
Patient denies SI,HI,AVH. Patient is cooperative and interacts well with staff.  Respiratory is even and unlabored. No distress noted. Patient is watching TV at present. Patient stated no complaints at present. will continue to monitor for safety.  
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
## 2021-12-25 NOTE — Discharge Instructions (Signed)

## 2021-12-25 NOTE — ED Notes (Signed)
Snack given.

## 2021-12-26 NOTE — ED Notes (Signed)
Pt is in the bed sleeping. Respirations are even and unlabored. No acute distress noted. Will continue to monitor for safety. 

## 2022-02-22 ENCOUNTER — Emergency Department (HOSPITAL_COMMUNITY): Payer: Self-pay

## 2022-02-22 ENCOUNTER — Encounter (HOSPITAL_COMMUNITY): Payer: Self-pay

## 2022-02-22 ENCOUNTER — Emergency Department (HOSPITAL_COMMUNITY)
Admission: EM | Admit: 2022-02-22 | Discharge: 2022-02-23 | Disposition: A | Discharge status: Home / Self Care | Payer: Self-pay | Attending: Emergency Medicine | Admitting: Emergency Medicine

## 2022-02-22 ENCOUNTER — Other Ambulatory Visit: Payer: Self-pay

## 2022-02-22 DIAGNOSIS — Y908 Blood alcohol level of 240 mg/100 ml or more: Secondary | ICD-10-CM | POA: Insufficient documentation

## 2022-02-22 DIAGNOSIS — E876 Hypokalemia: Secondary | ICD-10-CM | POA: Insufficient documentation

## 2022-02-22 DIAGNOSIS — D649 Anemia, unspecified: Secondary | ICD-10-CM | POA: Insufficient documentation

## 2022-02-22 DIAGNOSIS — T7421XA Adult sexual abuse, confirmed, initial encounter: Secondary | ICD-10-CM | POA: Insufficient documentation

## 2022-02-22 DIAGNOSIS — E870 Hyperosmolality and hypernatremia: Secondary | ICD-10-CM | POA: Insufficient documentation

## 2022-02-22 LAB — BASIC METABOLIC PANEL
Anion gap: 9 (ref 5–15)
BUN: <5 mg/dL — ABNORMAL LOW (ref 6–20)
CO2: 31 mmol/L (ref 22–32)
Calcium: 8.6 mg/dL — ABNORMAL LOW (ref 8.9–10.3)
Chloride: 109 mmol/L (ref 98–111)
Creatinine, Ser: 0.69 mg/dL (ref 0.44–1.00)
GFR, Estimated: >60 mL/min (ref >60)
Glucose, Bld: 91 mg/dL (ref 70–99)
Potassium: 3 mmol/L — ABNORMAL LOW (ref 3.5–5.1)
Sodium: 149 mmol/L — ABNORMAL HIGH (ref 135–145)

## 2022-02-22 LAB — CBC WITH DIFFERENTIAL/PLATELET
Abs Immature Granulocytes: 0.01 10*3/uL (ref 0.00–0.07)
Basophils Absolute: 0.1 10*3/uL (ref 0.0–0.1)
Basophils Relative: 1 %
Eosinophils Absolute: 0 10*3/uL (ref 0.0–0.5)
Eosinophils Relative: 1 %
HCT: 34.4 % — ABNORMAL LOW (ref 36.0–46.0)
Hemoglobin: 11.2 g/dL — ABNORMAL LOW (ref 12.0–15.0)
Immature Granulocytes: 0 %
Lymphocytes Relative: 42 %
Lymphs Abs: 2 10*3/uL (ref 0.7–4.0)
MCH: 32.3 pg (ref 26.0–34.0)
MCHC: 32.6 g/dL (ref 30.0–36.0)
MCV: 99.1 fL (ref 80.0–100.0)
Monocytes Absolute: 0.2 10*3/uL (ref 0.1–1.0)
Monocytes Relative: 5 %
Neutro Abs: 2.4 10*3/uL (ref 1.7–7.7)
Neutrophils Relative %: 51 %
Platelets: 466 10*3/uL — ABNORMAL HIGH (ref 150–400)
RBC: 3.47 MIL/uL — ABNORMAL LOW (ref 3.87–5.11)
RDW: 12.6 % (ref 11.5–15.5)
WBC: 4.6 10*3/uL (ref 4.0–10.5)
nRBC: 0 % (ref 0.0–0.2)

## 2022-02-22 LAB — RAPID URINE DRUG SCREEN, HOSP PERFORMED
Amphetamines: NOT DETECTED
Barbiturates: NOT DETECTED
Benzodiazepines: NOT DETECTED
Cocaine: NOT DETECTED
Opiates: NOT DETECTED
Tetrahydrocannabinol: NOT DETECTED

## 2022-02-22 LAB — URINALYSIS, ROUTINE W REFLEX MICROSCOPIC
Bilirubin Urine: NEGATIVE
Glucose, UA: NEGATIVE mg/dL
Hgb urine dipstick: NEGATIVE
Ketones, ur: NEGATIVE mg/dL
Leukocytes,Ua: NEGATIVE
Nitrite: NEGATIVE
Protein, ur: NEGATIVE mg/dL
Specific Gravity, Urine: 1.006 (ref 1.005–1.030)
pH: 8 (ref 5.0–8.0)

## 2022-02-22 LAB — I-STAT BETA HCG BLOOD, ED (MC, WL, AP ONLY): I-stat hCG, quantitative: <5 m[IU]/mL (ref <5)

## 2022-02-22 LAB — ETHANOL: Alcohol, Ethyl (B): 343 mg/dL (ref <10)

## 2022-02-22 MED ORDER — IBUPROFEN 400 MG PO TABS
400.0000 mg | ORAL_TABLET | Freq: Once | ORAL | Status: AC
  Administered 2022-02-22: 400 mg via ORAL
  Filled 2022-02-22: qty 1

## 2022-02-22 MED ORDER — LIDOCAINE HCL (PF) 1 % IJ SOLN
1.0000 mL | Freq: Once | INTRAMUSCULAR | Status: AC
  Administered 2022-02-22: 1 mL

## 2022-02-22 MED ORDER — METRONIDAZOLE 500 MG PO TABS
2000.0000 mg | ORAL_TABLET | Freq: Once | ORAL | Status: AC
  Administered 2022-02-22: 2000 mg via ORAL

## 2022-02-22 MED ORDER — PROMETHAZINE HCL 25 MG PO TABS
25.0000 mg | ORAL_TABLET | Freq: Four times a day (QID) | ORAL | Status: DC | PRN
  Administered 2022-02-22: 75 mg via ORAL

## 2022-02-22 MED ORDER — ULIPRISTAL ACETATE 30 MG PO TABS
30.0000 mg | ORAL_TABLET | Freq: Once | ORAL | Status: AC
  Administered 2022-02-22: 30 mg via ORAL

## 2022-02-22 MED ORDER — CEFTRIAXONE SODIUM 500 MG IJ SOLR
500.0000 mg | Freq: Once | INTRAMUSCULAR | Status: AC
  Administered 2022-02-22: 500 mg via INTRAMUSCULAR

## 2022-02-22 MED ORDER — IOHEXOL 350 MG/ML SOLN
75.0000 mL | Freq: Once | INTRAVENOUS | Status: AC | PRN
  Administered 2022-02-22: 75 mL via INTRAVENOUS

## 2022-02-22 MED ORDER — AZITHROMYCIN 250 MG PO TABS
1000.0000 mg | ORAL_TABLET | Freq: Once | ORAL | Status: AC
  Administered 2022-02-22: 1000 mg via ORAL
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
## 2022-02-22 NOTE — ED Notes (Signed)
[employer] [address]. PA made aware.  ?
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
## 2022-02-22 NOTE — SANE Note (Signed)
On 02/22/2022, at approximately 2209 hours, the SANE/FNE Teacher, music) consult was completed. The primary RN and provider have been notified. Please contact the SANE/FNE nurse on call (listed in Amion) with any further concerns.  ?

## 2022-02-22 NOTE — SANE Note (Signed)
Pt moved to Room 36 to discuss options with SANE/FNE RN. ?
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
## 2022-02-23 ENCOUNTER — Telehealth: Payer: Self-pay | Admitting: Family Medicine

## 2022-02-23 ENCOUNTER — Emergency Department (HOSPITAL_COMMUNITY): Payer: Self-pay

## 2022-02-23 MED ORDER — IOHEXOL 350 MG/ML SOLN
65.0000 mL | Freq: Once | INTRAVENOUS | Status: AC | PRN
  Administered 2022-02-23: 65 mL via INTRAVENOUS

## 2022-02-23 NOTE — ED Notes (Signed)
Patient verbalizes understanding of discharge instructions. Opportunity for questioning and answers were provided. Armband removed by staff, pt discharged from ED ambulatory.   

## 2022-02-23 NOTE — Telephone Encounter (Signed)
Attempted to call patient to inform of scheduled appointment, there was no answer to the call and the option to leave a voicemail was unavailable. A letter was mailed.  ?

## 2022-02-23 NOTE — ED Notes (Signed)
Pt ambulatory to restroom

## 2022-02-23 NOTE — ED Notes (Signed)
Pt mother called to come pick pt up  ?
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
## 2022-02-28 ENCOUNTER — Other Ambulatory Visit (HOSPITAL_COMMUNITY)
Admission: EM | Admit: 2022-02-28 | Discharge: 2022-03-02 | Disposition: A | Discharge status: Home / Self Care | Payer: No Payment, Other | Attending: Psychiatry | Admitting: Psychiatry

## 2022-02-28 DIAGNOSIS — F1914 Other psychoactive substance abuse with psychoactive substance-induced mood disorder: Secondary | ICD-10-CM | POA: Diagnosis not present

## 2022-02-28 DIAGNOSIS — F1994 Other psychoactive substance use, unspecified with psychoactive substance-induced mood disorder: Secondary | ICD-10-CM

## 2022-02-28 DIAGNOSIS — F4310 Post-traumatic stress disorder, unspecified: Secondary | ICD-10-CM | POA: Diagnosis not present

## 2022-02-28 DIAGNOSIS — F321 Major depressive disorder, single episode, moderate: Secondary | ICD-10-CM | POA: Diagnosis not present

## 2022-02-28 DIAGNOSIS — Z79899 Other long term (current) drug therapy: Secondary | ICD-10-CM | POA: Insufficient documentation

## 2022-02-28 DIAGNOSIS — F10129 Alcohol abuse with intoxication, unspecified: Secondary | ICD-10-CM | POA: Diagnosis not present

## 2022-02-28 DIAGNOSIS — Z20822 Contact with and (suspected) exposure to covid-19: Secondary | ICD-10-CM | POA: Insufficient documentation

## 2022-02-28 LAB — URINALYSIS, COMPLETE (UACMP) WITH MICROSCOPIC
Bacteria, UA: NONE SEEN
Bilirubin Urine: NEGATIVE
Glucose, UA: NEGATIVE mg/dL
Hgb urine dipstick: NEGATIVE
Ketones, ur: NEGATIVE mg/dL
Leukocytes,Ua: NEGATIVE
Nitrite: NEGATIVE
Protein, ur: NEGATIVE mg/dL
Specific Gravity, Urine: 1.009 (ref 1.005–1.030)
pH: 7 (ref 5.0–8.0)

## 2022-02-28 LAB — POCT URINE DRUG SCREEN - MANUAL ENTRY (I-SCREEN)
POC Amphetamine UR: NOT DETECTED
POC Buprenorphine (BUP): NOT DETECTED
POC Cocaine UR: NOT DETECTED
POC Marijuana UR: NOT DETECTED
POC Methadone UR: NOT DETECTED
POC Methamphetamine UR: NOT DETECTED
POC Morphine: NOT DETECTED
POC Oxazepam (BZO): NOT DETECTED
POC Oxycodone UR: NOT DETECTED
POC Secobarbital (BAR): NOT DETECTED

## 2022-02-28 LAB — RESP PANEL BY RT-PCR (FLU A&B, COVID) ARPGX2
Influenza A by PCR: NEGATIVE
Influenza B by PCR: NEGATIVE
SARS Coronavirus 2 by RT PCR: NEGATIVE

## 2022-02-28 LAB — PREGNANCY, URINE: Preg Test, Ur: NEGATIVE

## 2022-02-28 LAB — POCT PREGNANCY, URINE: Preg Test, Ur: NEGATIVE

## 2022-02-28 MED ORDER — TRAZODONE HCL 50 MG PO TABS
50.0000 mg | ORAL_TABLET | Freq: Every evening | ORAL | Status: DC | PRN
  Administered 2022-02-28 – 2022-03-01 (×2): 50 mg via ORAL
  Filled 2022-02-28 (×2): qty 1

## 2022-02-28 MED ORDER — ESCITALOPRAM OXALATE 10 MG PO TABS
10.0000 mg | ORAL_TABLET | Freq: Every day | ORAL | Status: DC
  Administered 2022-02-28 – 2022-03-02 (×3): 10 mg via ORAL
  Filled 2022-02-28 (×3): qty 1

## 2022-02-28 MED ORDER — THIAMINE HCL 100 MG PO TABS
100.0000 mg | ORAL_TABLET | Freq: Every day | ORAL | Status: DC
  Administered 2022-03-01 – 2022-03-02 (×2): 100 mg via ORAL
  Filled 2022-02-28 (×2): qty 1

## 2022-02-28 MED ORDER — ADULT MULTIVITAMIN W/MINERALS CH
1.0000 | ORAL_TABLET | Freq: Every day | ORAL | Status: DC
  Administered 2022-02-28 – 2022-03-02 (×3): 1 via ORAL
  Filled 2022-02-28 (×3): qty 1

## 2022-02-28 MED ORDER — ACETAMINOPHEN 325 MG PO TABS: 650.0000 mg | ORAL_TABLET | Freq: Four times a day (QID) | ORAL | Status: DC | PRN

## 2022-02-28 MED ORDER — ONDANSETRON 4 MG PO TBDP: 4.0000 mg | ORAL_TABLET | Freq: Four times a day (QID) | ORAL | Status: DC | PRN

## 2022-02-28 MED ORDER — LORAZEPAM 1 MG PO TABS: 1.0000 mg | ORAL_TABLET | Freq: Four times a day (QID) | ORAL | Status: DC | PRN

## 2022-02-28 MED ORDER — LOPERAMIDE HCL 2 MG PO CAPS: 2.0000 mg | ORAL_CAPSULE | ORAL | Status: DC | PRN

## 2022-02-28 MED ORDER — HYDROXYZINE HCL 25 MG PO TABS
25.0000 mg | ORAL_TABLET | Freq: Four times a day (QID) | ORAL | Status: DC | PRN
Start: 2022-02-28 — End: 2022-03-02
  Administered 2022-02-28 – 2022-03-01 (×2): 25 mg via ORAL
  Filled 2022-02-28 (×2): qty 1

## 2022-02-28 MED ORDER — MAGNESIUM HYDROXIDE 400 MG/5ML PO SUSP: 30.0000 mL | Freq: Every day | ORAL | Status: DC | PRN

## 2022-02-28 MED ORDER — HYDROCHLOROTHIAZIDE 12.5 MG PO TABS
12.5000 mg | ORAL_TABLET | Freq: Every day | ORAL | Status: DC
  Administered 2022-02-28 – 2022-03-02 (×3): 12.5 mg via ORAL
  Filled 2022-02-28 (×3): qty 1

## 2022-02-28 MED ORDER — THIAMINE HCL 100 MG/ML IJ SOLN
100.0000 mg | Freq: Once | INTRAMUSCULAR | Status: AC
  Administered 2022-02-28: 100 mg via INTRAMUSCULAR
  Filled 2022-02-28: qty 2

## 2022-02-28 MED ORDER — ALUM & MAG HYDROXIDE-SIMETH 200-200-20 MG/5ML PO SUSP: 30.0000 mL | ORAL | Status: DC | PRN

## 2022-02-28 NOTE — ED Notes (Signed)
Pt in dayroom watching television. Pt calm and cooperative. No c/o pain or distress. Will continue to monitor for safety °

## 2022-02-28 NOTE — ED Notes (Signed)
Late Entry-Patient was admitted to Obs to await Covid results. Covid test was negative. Patient was intoxicated when she was admitted. Patient was assessed and brought to unit. Patient has been cooperative and calm.  ?
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
## 2022-02-28 NOTE — ED Notes (Signed)
Pt. Is attending AA. ?
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
## 2022-02-28 NOTE — ED Notes (Signed)
Pt sleeping@this time. Breathing even and unlabored. Will continue to monitor for safety 

## 2022-02-28 NOTE — Progress Notes (Signed)
?   02/28/22 0930  ?[facility] Triage Screening (Walk-ins at [facility] only)  ?How Did You Hear About [place]? Family/Friend  ?What Is the Reason for Your Visit/Call Today? Patient presents voluntarily, accompanied by mother and grandmother, reporting they "wanted me to come here."  Patient admits to drinking PTA and states she feels the alcohol "with me still."  She appears intoxicated and states she is interested in treatment.  She reports drinking 2-3 times per week, having relapsed on her [birth year]. She was sober during 37 day treatment at [facility] and relapsed two days after d/c.  She is mostly interested in counseling, stating she has some hx of trauma which she feels she is using alcohol to self-medicate for.  She is not open to residential treatment at this point, as she feels the men there were distracting and inappropriate at points.  Patient denies SI, HI, and AVH.  ?How Long Has This Been Causing You Problems? > than 6 months  ?Have You Recently Had Any Thoughts About Hurting Yourself? No  ?Are You Planning to Commit Suicide/Harm Yourself At This time? No  ?Have you Recently Had Thoughts About Hurting Someone [name]? No  ?Are You Planning To Harm Someone At This Time? No  ?Are you currently experiencing any auditory, visual or other hallucinations? No  ?Have You Used Any Alcohol or Drugs in the Past 24 Hours? Yes  ?How long ago did you use Drugs or Alcohol? This morning, prior to arrival  ?What Did You Use and How Much? pint of liquor - drank last night into this morning  ?Do you have any current medical co-morbidities that require immediate attention? No  ?Clinician description of patient physical appearance/behavior: Paitent is impaired, reporting she just drank liquor PTA, able to answer questions.  ?What Do You Feel Would Help You the Most Today? Alcohol or Drug Use Treatment  ?If access to [facility] was not available, would you have sought care in the Emergency Department? No  ?Determination of Need Routine (7  days)  ?Options For Referral Outpatient Therapy  ? ? ?

## 2022-02-28 NOTE — ED Notes (Signed)
Pt admitted to [facility] endorsing ETOH abuse. Pt states last drink was prior to coming into facility today. Pt states, "I'm an alcoholic. I just want to feel better and be good to myself. I'm hoping to get some counseling. I don't want rehab though". Support and encouragement provided. Pleasant and cooperative with staff. Denies SI/HI/AVH. Oriented to unit and unit rules. Informed pt to notify staff  with any needs or concerns. Pt denies withdrawal sx from ETOH at present. Will monitor for safety. ?

## 2022-03-01 ENCOUNTER — Encounter (HOSPITAL_COMMUNITY): Payer: Self-pay

## 2022-03-01 DIAGNOSIS — F10129 Alcohol abuse with intoxication, unspecified: Secondary | ICD-10-CM | POA: Diagnosis not present

## 2022-03-01 DIAGNOSIS — F321 Major depressive disorder, single episode, moderate: Secondary | ICD-10-CM | POA: Diagnosis not present

## 2022-03-01 DIAGNOSIS — F4310 Post-traumatic stress disorder, unspecified: Secondary | ICD-10-CM | POA: Diagnosis not present

## 2022-03-01 DIAGNOSIS — F1914 Other psychoactive substance abuse with psychoactive substance-induced mood disorder: Secondary | ICD-10-CM | POA: Diagnosis not present

## 2022-03-01 NOTE — ED Notes (Signed)
No pain or discomfort noted/ reported. Pt alert, oriented, and ambulatory. Currently in dayroom watching tv with peers.  Breathing is even and  unlabored.  Will continue to monitor for safety.  ?

## 2022-03-01 NOTE — BH IP Treatment Plan (Signed)
Interdisciplinary Treatment and Diagnostic Plan Update ? ?03/01/2022 ?Time of Session: 11:30AM ? ?Diane Dickson ?MRN: [id number] ? ?Diagnosis:  ?Final diagnoses:  ?Alcohol abuse with intoxication (HCC)  ?Current moderate episode of major depressive disorder, unspecified whether recurrent (HCC)  ?Substance induced mood disorder (HCC)  ?PTSD (post-traumatic stress disorder)  ? ? ? ?Current Medications:  ?Current Facility-Administered Medications  ?Medication Dose Route Frequency Provider Last Rate Last Admin  ? acetaminophen (TYLENOL) tablet 650 mg  650 mg Oral Q6H PRN Ardis Hughs, NP      ? alum & mag hydroxide-simeth (MAALOX/MYLANTA) 200-200-20 MG/5ML suspension 30 mL  30 mL Oral Q4H PRN Ardis Hughs, NP      ? escitalopram (LEXAPRO) tablet 10 mg  10 mg Oral Daily Ardis Hughs, NP   10 mg at 03/01/22 0915  ? hydrochlorothiazide (HYDRODIURIL) tablet 12.5 mg  12.5 mg Oral Daily Vernard Gambles H, NP   12.5 mg at 03/01/22 0915  ? hydrOXYzine (ATARAX) tablet 25 mg  25 mg Oral Q6H PRN Ardis Hughs, NP   25 mg at 02/28/22 2151  ? loperamide (IMODIUM) capsule 2-4 mg  2-4 mg Oral PRN Ardis Hughs, NP      ? LORazepam (ATIVAN) tablet 1 mg  1 mg Oral Q6H PRN Ardis Hughs, NP      ? magnesium hydroxide (MILK OF MAGNESIA) suspension 30 mL  30 mL Oral Daily PRN Ardis Hughs, NP      ? multivitamin with minerals tablet 1 tablet  1 tablet Oral Daily Ardis Hughs, NP   1 tablet at 03/01/22 0915  ? ondansetron (ZOFRAN-ODT) disintegrating tablet 4 mg  4 mg Oral Q6H PRN Ardis Hughs, NP      ? thiamine tablet 100 mg  100 mg Oral Daily Ardis Hughs, NP   100 mg at 03/01/22 0915  ? traZODone (DESYREL) tablet 50 mg  50 mg Oral QHS PRN Ardis Hughs, NP   50 mg at 02/28/22 2152  ? ?Current Outpatient Medications  ?Medication Sig Dispense Refill  ? escitalopram (LEXAPRO) 10 MG tablet Take 1 tablet (10 mg total) by mouth daily. 30 tablet 1  ? hydrochlorothiazide  (HYDRODIURIL) 12.5 MG tablet Take 1 tablet (12.5 mg total) by mouth daily. 30 tablet 1  ? hydrOXYzine (ATARAX) 25 MG tablet Take 1 tablet (25 mg total) by mouth 3 (three) times daily as needed for anxiety. 90 tablet 1  ? traZODone (DESYREL) 50 MG tablet Take 1 tablet (50 mg total) by mouth at bedtime as needed for sleep. 30 tablet 1  ? ?PTA Medications: ?Prior to Admission medications   ?Medication Sig Start Date End Date Taking? Authorizing Provider  ?escitalopram (LEXAPRO) 10 MG tablet Take 1 tablet (10 mg total) by mouth daily. 12/25/21  Yes Ardis Hughs, NP  ?hydrochlorothiazide (HYDRODIURIL) 12.5 MG tablet Take 1 tablet (12.5 mg total) by mouth daily. 12/25/21  Yes Ardis Hughs, NP  ?hydrOXYzine (ATARAX) 25 MG tablet Take 1 tablet (25 mg total) by mouth 3 (three) times daily as needed for anxiety. 12/25/21  Yes Ardis Hughs, NP  ?traZODone (DESYREL) 50 MG tablet Take 1 tablet (50 mg total) by mouth at bedtime as needed for sleep. 12/25/21  Yes Ardis Hughs, NP  ? ? ?Patient Stressors: Substance abuse   ? ?Patient Strengths: Ability for insight  ?General fund of knowledge  ?Supportive family/friends  ? ?Treatment Modalities: Medication Management, Group therapy, Case management,  ?1 to  1 session with clinician, Psychoeducation, Recreational therapy. ? ? ?Physician Treatment Plan for Primary and Secondary Diagnosis:  ?Final diagnoses:  ?Alcohol abuse with intoxication (HCC)  ?Current moderate episode of major depressive disorder, unspecified whether recurrent (HCC)  ?Substance induced mood disorder (HCC)  ?PTSD (post-traumatic stress disorder)  ? ?Long Term Goal(s): Improvement in symptoms so as ready for discharge ? ?Short Term Goals: Ability to identify changes in lifestyle to reduce recurrence of condition will improve ?Ability to demonstrate self-control will improve ?Ability to identify and develop effective coping behaviors will improve ?Ability to identify triggers associated with  substance abuse/mental health issues will improve ? ?Medication Management: Evaluate patient's response, side effects, and tolerance of medication regimen. ? ?Therapeutic Interventions: 1 to 1 sessions, Unit Group sessions and Medication administration. ? ?Evaluation of Outcomes: Progressing ? ?RN Treatment Plan for Primary Diagnosis:  ?Final diagnoses:  ?Alcohol abuse with intoxication (HCC)  ?Current moderate episode of major depressive disorder, unspecified whether recurrent (HCC)  ?Substance induced mood disorder (HCC)  ?PTSD (post-traumatic stress disorder)  ? ? ?Long Term Goal(s): Knowledge of disease and therapeutic regimen to maintain health will improve ? ?Short Term Goals: Ability to demonstrate self-control, Ability to participate in decision making will improve, and Ability to identify and develop effective coping behaviors will improve ? ?Medication Management: RN will administer medications as ordered by provider, will assess and evaluate patient's response and provide education to patient for prescribed medication. RN will report any adverse and/or side effects to prescribing provider. ? ?Therapeutic Interventions: 1 on 1 counseling sessions, Psychoeducation, Medication administration, Evaluate responses to treatment, Monitor vital signs and CBGs as ordered, Perform/monitor CIWA, COWS, AIMS and Fall Risk screenings as ordered, Perform wound care treatments as ordered. ? ?Evaluation of Outcomes: Progressing ? ? ?LCSW Treatment Plan for Primary Diagnosis:  ?Final diagnoses:  ?Alcohol abuse with intoxication (HCC)  ?Current moderate episode of major depressive disorder, unspecified whether recurrent (HCC)  ?Substance induced mood disorder (HCC)  ?PTSD (post-traumatic stress disorder)  ? ? ?Long Term Goal(s): Safe transition to appropriate next level of care at discharge, Engage patient in therapeutic group addressing interpersonal concerns. ? ?Short Term Goals: Engage patient in aftercare planning with  referrals and resources and Increase skills for wellness and recovery ? ?Therapeutic Interventions: Assess for all discharge needs, 1 to 1 time with Child psychotherapist, Explore available resources and support systems, Assess for adequacy in community support network, Educate family and significant other(s) on suicide prevention, Complete Psychosocial Assessment, Interpersonal group therapy. ? ?Evaluation of Outcomes: Progressing ? ? ?Progress in Treatment: ?Attending groups: Yes. ?Participating in groups: Yes. ?Taking medication as prescribed: Yes. ?Toleration medication: Yes. ?Family/Significant other contact made: No, will contact:  No one ?Patient understands diagnosis: Yes. ?Discussing patient identified problems/goals with staff: Yes. ?Medical problems stabilized or resolved: Yes. ?Denies suicidal/homicidal ideation: Yes. ?Issues/concerns per patient self-inventory: No. ?Other: None  ? ?New problem(s) identified: None  ? ?New Short Term/Long Term Goal(s): Diane Dickson wants to increase her participation in outpatient substance abuse services and AA meetings on a weekly basis. Diane Dickson has not participated in any outpatient substance abuse services at this time.  ? ?Patient Goals:  "I just want to detox and do some outpatient services" ? ?Discharge Plan or Barriers: Diane Dickson plans to return home with her grandmother and participate in outpatient substance abuse services, in addition to [employer]  ? ?Reason for Continuation of Hospitalization: Medication stabilization ?Withdrawal symptoms ? ?Estimated Length of Stay: 2-3 days ? ?Last 3 [place] Suicide Severity Risk  Score: ?[facility]  ?C-SSRS RISK CATEGORY No Risk No Risk No Risk  ? ?  ? ? ?Last PHQ 2/9 Scores: ? ?  02/28/2022  ? 10:45 AM  ?Depression screen PHQ 2/9  ?Decreased  Interest 1  ?Down, Depressed, Hopeless 1  ?PHQ - 2 Score 2  ?Altered sleeping 2  ?Tired, decreased energy 1  ?Change in appetite 3  ?Feeling bad or failure about yourself  2  ?Trouble concentrating 0  ?Moving slowly or fidget

## 2022-03-01 NOTE — ED Notes (Signed)
Pt is currently sleeping, no distress noted, environmental check complete, will continue to monitor patient for safety. ? ?

## 2022-03-01 NOTE — Clinical Social Work Psych Note (Signed)
LCSW Update ? ?Diane Dickson reports feeling "good" this morning. She reports she was able to receive "some good sleep" last night. Diane Dickson was lying in the bed and appeared to just be waking up before entering her room.  ? ?Diane Dickson denied having any issues with her current medication regiment as well. Diane Dickson denied having any SI, HI or AVH at this time. Diane Dickson presented with a flat affect, congruent mood at this time.  ? ?Diane Dickson continues to decline residential substance abuse treatment, however continues to express interest in outpatient substance abuse resources.  ? ?Diane Dickson denied having any additional questions, concerns or needs at this time.  ? ?LCSW will continue to follow.  ? ? ?Baldo Daub, MSW, LCSW ?Clinical Child psychotherapist Hydrographic surveyor) ?[facility]  ? ?

## 2022-03-01 NOTE — ED Notes (Signed)
Pt is in dayroom watching television, eating snack, and socializing with other patients. ?

## 2022-03-01 NOTE — ED Notes (Signed)
Checked on pt, she said she is ok watching movie with peers ?

## 2022-03-01 NOTE — ED Provider Notes (Signed)
Behavioral Health Progress Note ? ?Date and Time: 03/01/2022 1:52 PM ?Name: Diane Dickson ?MRN:  [id number] ? 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 Patient was admitted to the [facility] for alcohol detox and crisis stabilization. Home medications restarted and CIWA protocol initiated. Patient admitted to the [facility] for crisis stabilization and detox. UDS negative. Etoh 343 on 02/22/22; unable to be collected this admission. ? ?Patient seen and chart reviewed. Most recent CIWA 0. Patient describes her mood as "good". She reports sleeping well and denies issues with appetite. She denies symptoms of alcohol withdrawal including nausea, vomiting, tremors, headache, diaphoresis, GI upset.She states that she has been attending groups and expressed that the [place] group yesterday was "good". No other concerns at this time. Patient continues to express desire for outpatient treatment after discharge. Patient was given the opportunity to ask questions and  All questions answered. Patient verbalized understanding regarding plan of care.  ? ? ?Diagnosis:  ?Final diagnoses:  ?Alcohol abuse with intoxication ([place])  ?Current moderate episode of major depressive disorder, unspecified whether recurrent ([place])  ?Substance induced mood disorder ([place])  ?PTSD (post-traumatic stress disorder)  ? ? ?Total Time spent with patient: 15 minutes ? ?Past Psychiatric History: AUD, MDD, PTSD ?Past Medical History:  ?Past Medical History:  ?Diagnosis Date  ? Alcohol abuse   ? Bipolar 1 disorder ([place])   ? Pneumomediastinum ([place])   ? PTSD (post-traumatic stress disorder)   ? No past surgical history on file. ?Family History:  ?Family History  ?Problem Relation Age of Onset  ? Healthy Mother   ? ?Family Psychiatric  History: denies ?Social History:  ?Social History  ? ?Substance and Sexual Activity  ?Alcohol Use Yes  ? Comment: daily  ?   ?Social History  ? ?Substance and Sexual Activity   ?Drug Use Never  ?  ?Social History  ? ?Socioeconomic History  ? Marital status: Single  ?  Spouse name: Not on file  ? Number of children: Not on file  ? Years of education: Not on file  ? Highest education level: Not on file  ?Occupational History  ? Not on file  ?Tobacco Use  ? Smoking status: Never  ? Smokeless tobacco: Never  ?Vaping Use  ? Vaping Use: Some days  ? Substances: Nicotine  ?Substance and Sexual Activity  ? Alcohol use: Yes  ?  Comment: daily  ? Drug use: Never  ? Sexual activity: Not on file  ?Other Topics Concern  ? Not on file  ?Social History Narrative  ? Not on file  ? ?Social Determinants of Health  ? ?Financial Resource Strain: Not on file  ?Food Insecurity: Not on file  ?Transportation Needs: Not on file  ?Physical Activity: Not on file  ?Stress: Not on file  ?Social Connections: Not on file  ? ?SDOH:  ?SDOH Screenings  ? ?Alcohol Screen: Not on file  ?Depression (PHQ2-9): Medium Risk  ? PHQ-2 Score: 12  ?Financial Resource Strain: Not on file  ?Food Insecurity: Not on file  ?Housing: Not on file  ?Physical Activity: Not on file  ?Social Connections: Not on file  ?Stress: Not on file  ?Tobacco Use: Low Risk   ? Smoking Tobacco Use: Never  ? Smokeless Tobacco Use: Never  ? Passive Exposure: Not on file  ?Transportation Needs: Not on file  ? ?Additional Social History:  ?  ?Pain Medications: None reported ?Prescriptions: Patient denies ?  Over the Counter: Patient denies ?History of alcohol / drug use?: Yes ?Longest period of sobriety (when/how long): During childhood; Patient endorsed excessive alcohol use since the age of 26yo. ?Negative Consequences of Use: Legal, Financial, Personal relationships, Work / School ?Withdrawal Symptoms: None ?  ?  ?  ?  ?  ?  ?  ?  ?  ? ?Sleep: Good ? ?Appetite:  Good ? ?Current Medications:  ?Current Facility-Administered Medications  ?Medication Dose Route Frequency Provider Last Rate Last Admin  ? acetaminophen (TYLENOL) tablet 650 mg  650 mg Oral Q6H PRN  Revonda Humphrey, NP      ? alum & mag hydroxide-simeth (MAALOX/MYLANTA) 200-200-20 MG/5ML suspension 30 mL  30 mL Oral Q4H PRN Revonda Humphrey, NP      ? escitalopram (LEXAPRO) tablet 10 mg  10 mg Oral Daily Revonda Humphrey, NP   10 mg at 03/01/22 0915  ? hydrochlorothiazide (HYDRODIURIL) tablet 12.5 mg  12.5 mg Oral Daily Thomes Lolling H, NP   12.5 mg at 03/01/22 0915  ? hydrOXYzine (ATARAX) tablet 25 mg  25 mg Oral Q6H PRN Revonda Humphrey, NP   25 mg at 02/28/22 2151  ? loperamide (IMODIUM) capsule 2-4 mg  2-4 mg Oral PRN Revonda Humphrey, NP      ? LORazepam (ATIVAN) tablet 1 mg  1 mg Oral Q6H PRN Revonda Humphrey, NP      ? magnesium hydroxide (MILK OF MAGNESIA) suspension 30 mL  30 mL Oral Daily PRN Revonda Humphrey, NP      ? multivitamin with minerals tablet 1 tablet  1 tablet Oral Daily Revonda Humphrey, NP   1 tablet at 03/01/22 0915  ? ondansetron (ZOFRAN-ODT) disintegrating tablet 4 mg  4 mg Oral Q6H PRN Revonda Humphrey, NP      ? thiamine tablet 100 mg  100 mg Oral Daily Revonda Humphrey, NP   100 mg at 03/01/22 0915  ? traZODone (DESYREL) tablet 50 mg  50 mg Oral QHS PRN Revonda Humphrey, NP   50 mg at 02/28/22 2152  ? ?Current Outpatient Medications  ?Medication Sig Dispense Refill  ? escitalopram (LEXAPRO) 10 MG tablet Take 1 tablet (10 mg total) by mouth daily. 30 tablet 1  ? hydrochlorothiazide (HYDRODIURIL) 12.5 MG tablet Take 1 tablet (12.5 mg total) by mouth daily. 30 tablet 1  ? hydrOXYzine (ATARAX) 25 MG tablet Take 1 tablet (25 mg total) by mouth 3 (three) times daily as needed for anxiety. 90 tablet 1  ? traZODone (DESYREL) 50 MG tablet Take 1 tablet (50 mg total) by mouth at bedtime as needed for sleep. 30 tablet 1  ? ? ?Labs  ?Lab Results:  ?Admission on 02/28/2022  ?Component Date Value Ref Range Status  ? SARS Coronavirus 2 by RT PCR 02/28/2022 NEGATIVE  NEGATIVE Final  ? Comment: (NOTE) ?SARS-CoV-2 target nucleic acids are NOT DETECTED. ? ?The SARS-CoV-2 RNA  is generally detectable in upper respiratory ?specimens during the acute phase of infection. The lowest ?concentration of SARS-CoV-2 viral copies this assay can detect is ?138 copies/mL. A negative result does not preclude SARS-Cov-2 ?infection and should not be used as the sole basis for treatment or ?other patient management decisions. A negative result may occur with  ?improper specimen collection/handling, submission of specimen other ?than nasopharyngeal swab, presence of viral mutation(s) within the ?areas targeted by this assay, and inadequate number of viral ?copies(<138 copies/mL). A negative result must be combined with ?clinical  observations, patient history, and epidemiological ?information. The expected result is Negative. ? ?Fact Sheet for Patients:  ?EntrepreneurPulse.com.au ? ?Fact Sheet for Healthcare Providers:  ?IncredibleEmployment.be ? ?This test is no  ?                        t yet approved or cleared by the [place] FDA and  ?has been authorized for detection and/or diagnosis of SARS-CoV-2 by ?FDA under an Emergency Use Authorization (EUA). This EUA will remain  ?in effect (meaning this test can be used) for the duration of the ?COVID-19 declaration under Section 564(b)(1) of the Act, 21 ?U.S.C.section 360bbb-3(b)(1), unless the authorization is terminated  ?or revoked sooner.  ? ? ?  ? Influenza A by PCR 02/28/2022 NEGATIVE  NEGATIVE Final  ? Influenza B by PCR 02/28/2022 NEGATIVE  NEGATIVE Final  ? Comment: (NOTE) ?The Xpert Xpress SARS-CoV-2/FLU/RSV plus assay is intended as an aid ?in the diagnosis of influenza from Nasopharyngeal swab specimens and ?should not be used as a sole basis for treatment. Nasal washings and ?aspirates are unacceptable for Xpert Xpress SARS-CoV-2/FLU/RSV ?testing. ? ?Fact Sheet for Patients: ?EntrepreneurPulse.com.au ? ?Fact Sheet for Healthcare Providers: ?IncredibleEmployment.be ? ?This  test is not yet approved or cleared by the [place] FDA and ?has been authorized for detection and/or diagnosis of SARS-CoV-2 by ?FDA under an Emergency Use Authorization (EUA). This EUA will r

## 2022-03-01 NOTE — ED Notes (Signed)
Pt sleeping@this time. Breathing even and unlabored. Will continue to monitor for safety 

## 2022-03-01 NOTE — ED Notes (Signed)
PT did not want to attend AA ?

## 2022-03-01 NOTE — Clinical Social Work Psych Note (Addendum)
LCSW  Initial Note  ? ?LCSW met with Diane Dickson to discuss treatment and potential discharge planning.  ? ?Diane Dickson presented with a euthymic affect, congruent mood. Diane Dickson denied having any SI, HI, AVH. Diane Dickson denied having any physical or additional complaints.  ? ?Diane Dickson initially presented to the [facility] as a walk-in, seeking assistance for her substance abuse issues. Diane Dickson endorsed relapsing on ETOH. Diane Dickson reports drinking 2-3 times per week, having rel[birth year] her [birth year], which [birth year]. Diane Dickson reports she was sober during 37 days of treatment at [facility] and relapsed two days after being discharged from their program on 3/21.   ? ?Diane Dickson shared that she believes she uses ETOH to self-medicate her feelings from past traumatic experiences. Diane Dickson did report experiencing previus traumatic events, however chose not to disclose any further information, as she appeared to become tearful while answering those specific questions.  ? ?Diane Dickson reports that she continues to live with her grandmother, and her mother continues to be supportive. She reports her mother and grandmother brought her to the [facility] to address her drinking issues.  ? ?[name] declined any residential substance abuse treatment services at this time, however she did express interest in outpatient substance abuse resources at discharge. Diane Dickson reports she plans to return to her grandmother's home at discharge.  ? ?Diane Dickson denied having any additional questions or concerns at this time. LCSW will provide appropriate resources for outpatient substance abuse services.  ? ?LCSW will continue to follow.  ? ? ?Diane Dickson, MSW, LCSW ?Clinical Education officer, museum Insurance claims handler) ?[facility]  ? ?

## 2022-03-01 NOTE — Group Note (Signed)
Group Topic: Understanding Self  ?Group Date: 03/01/2022 ?Start Time: 1020 ?End Time: 1058 ?Facilitators: Doyne Keel E  ?Department: [facility] ? ?Number of Participants: 4  ?Group Focus: coping skills and personal responsibility ?Treatment Modality:  Individual Therapy and Psychoeducation ?Interventions utilized were patient education ?Purpose: enhance coping skills, regain self-worth, and reinforce self-care ? ?Name: Diane Dickson Date of Birth: 07-05-1996  ?MR: [id number]   ? ?Level of Participation: active ?Quality of Participation: attentive and cooperative ?Interactions with others: gave feedback ?Mood/Affect: appropriate ?Triggers (if applicable): n/a ?Cognition: coherent/clear ?Progress: Moderate ?Response: n/a ?Plan: follow-up needed ? ?Patients Problems:  ?Patient Active Problem List  ? Diagnosis Date Noted  ? Alcohol abuse with intoxication (HCC) 02/28/2022  ? Alcohol use 12/22/2021  ? Alcohol abuse 01/19/2021  ? Hypertension 01/19/2021  ? Pneumomediastinum (HCC) 01/03/2021  ?  ?

## 2022-03-01 NOTE — ED Notes (Signed)
Pt in dayroom watching television calm and cooperative. No c/o pain or distress. Will continue to monitor for safety 

## 2022-03-01 NOTE — ED Notes (Signed)
Pt remained in bed for breakfast.  She took morning medications without issue.  Denies pain, SI, HI, and AVH. Pt reports she slept  well over night.  Breathing is even and unlabored.  Will continue to monitor for safety. ?

## 2022-03-02 DIAGNOSIS — F4310 Post-traumatic stress disorder, unspecified: Secondary | ICD-10-CM | POA: Diagnosis not present

## 2022-03-02 DIAGNOSIS — F10129 Alcohol abuse with intoxication, unspecified: Secondary | ICD-10-CM | POA: Diagnosis not present

## 2022-03-02 DIAGNOSIS — F1914 Other psychoactive substance abuse with psychoactive substance-induced mood disorder: Secondary | ICD-10-CM | POA: Diagnosis not present

## 2022-03-02 DIAGNOSIS — F321 Major depressive disorder, single episode, moderate: Secondary | ICD-10-CM | POA: Diagnosis not present

## 2022-03-02 MED ORDER — THIAMINE HCL 100 MG PO TABS: 100.0000 mg | ORAL_TABLET | Freq: Every day | ORAL | 0 refills | Status: DC

## 2022-03-02 MED ORDER — ADULT MULTIVITAMIN W/MINERALS CH: 1.0000 | ORAL_TABLET | Freq: Every day | ORAL | 0 refills | Status: DC

## 2022-03-02 NOTE — Progress Notes (Signed)
Diane Dickson received her discharge order, she is calling her mom to take her home. She was presented with her AVS, her questions were answered and she retrieved her personal belongings. She was escorted to the lobby at 1445 hrs. ?

## 2022-03-02 NOTE — Progress Notes (Signed)
Received Diane Dickson this AM asleep in her bed, she got up to eat and remained in the dining room watching the TV. Later she was compliant with her medications. She denied all of the psychiatric symptoms including feeling suicidal. She is hoping to be discharged today after talking with the provider. Her affect is pleasant, good eye contact and calm. ?
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
## 2022-03-02 NOTE — ED Notes (Signed)
Pt sleeping@this time. Breathing even and unlabored. Will continue to monitor for safety 

## 2022-03-02 NOTE — Discharge Instructions (Addendum)

## 2022-03-08 ENCOUNTER — Telehealth: Payer: Self-pay | Admitting: Family Medicine

## 2022-03-08 ENCOUNTER — Ambulatory Visit: Payer: Self-pay

## 2022-03-08 ENCOUNTER — Encounter: Payer: Self-pay | Admitting: Family Medicine

## 2022-03-08 NOTE — Telephone Encounter (Signed)
Called patient to try to reschedule her appointment, there was no answer to the phone call and the number dialed as invalid. A letter will be mailed to the patient as well.  ?

## 2023-02-01 IMAGING — CT CT ABD-PELV W/ CM
2 of 4 series · 15 of 46 positions shown, 17 images · IV contrast (omnipaque)
Comparison: Chest CT dated 01/03/2021.

CLINICAL DATA: 24-year-old female with nausea vomiting.

EXAM:
CT ABDOMEN AND PELVIS WITH CONTRAST
TECHNIQUE: Multidetector CT imaging of the abdomen and pelvis was performed
using the standard protocol following bolus administration of
intravenous contrast.
CONTRAST:  100mL OMNIPAQUE IOHEXOL 300 MG/ML  SOLN

[Series 2: axial st · axial · 0.84mm/px · z∈[-571,-151]mm · 12 of 96 slices shown, 14 images]
[im 6/96  soft-tissue]
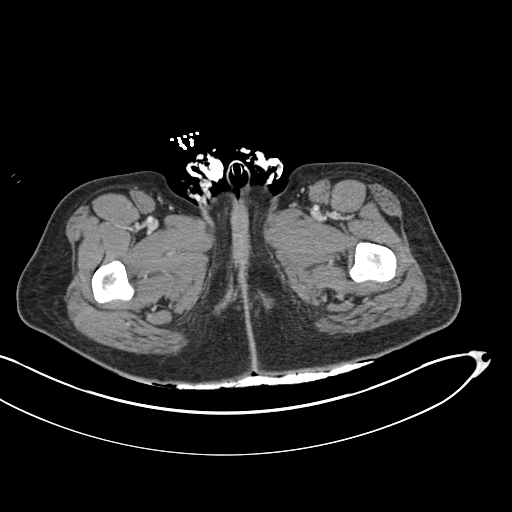
[im 6/96  bone]
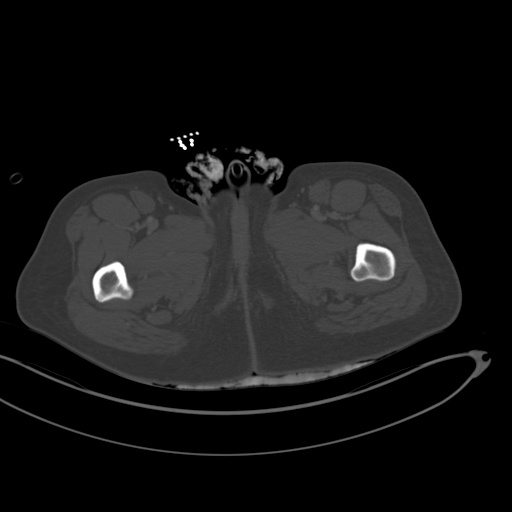
[im 12/96  soft-tissue]
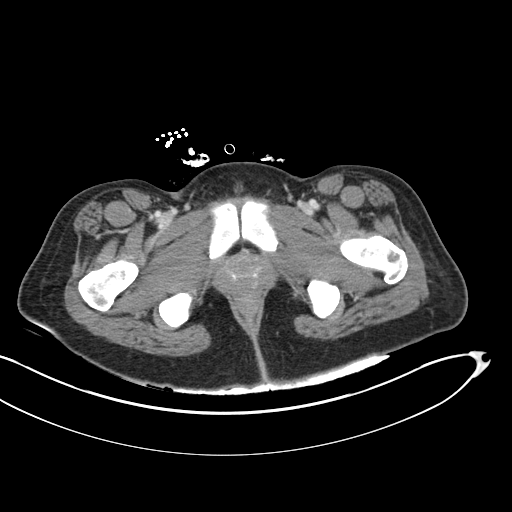
[im 24/96  soft-tissue]
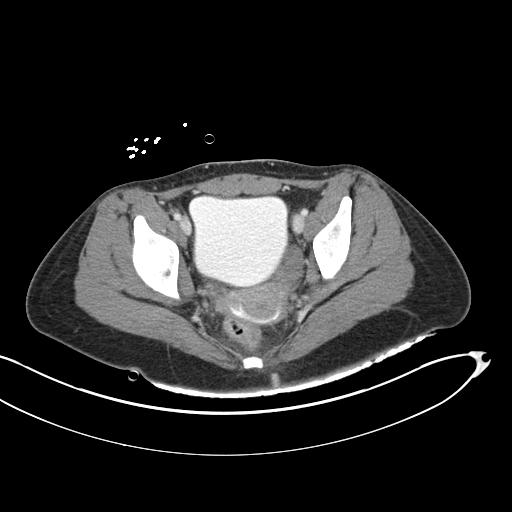
[im 30/96  soft-tissue]
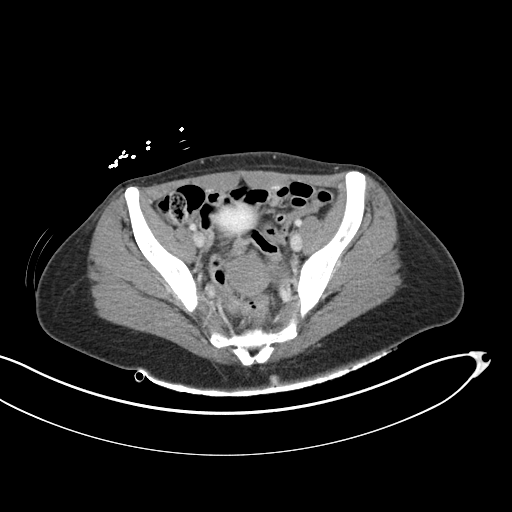
[im 36/96  soft-tissue]
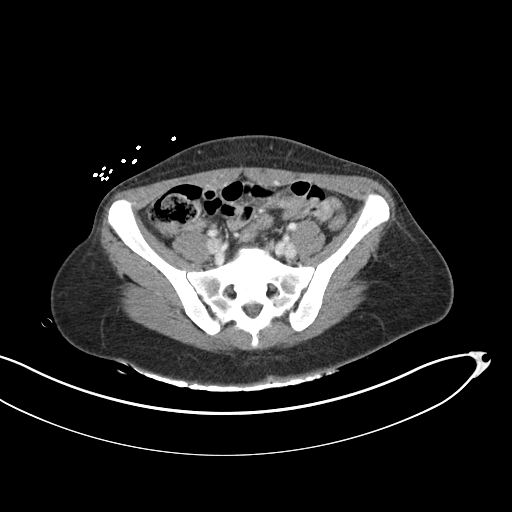
[im 42/96  soft-tissue]
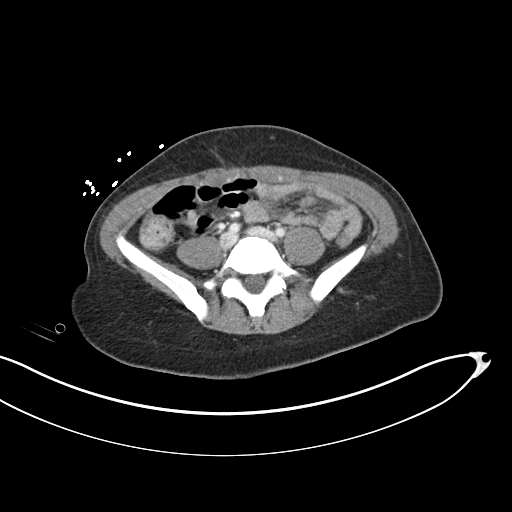
[im 54/96  soft-tissue]
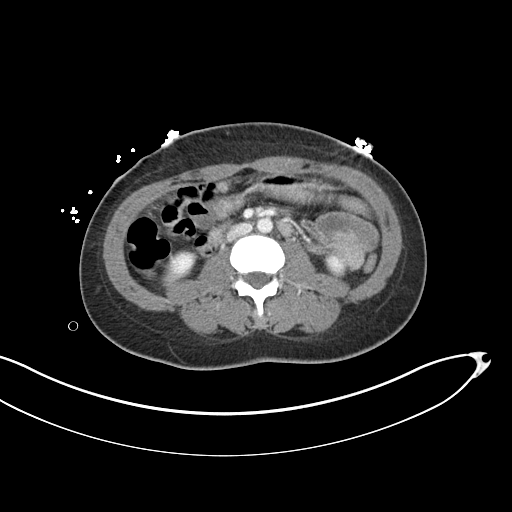
[im 60/96  soft-tissue]
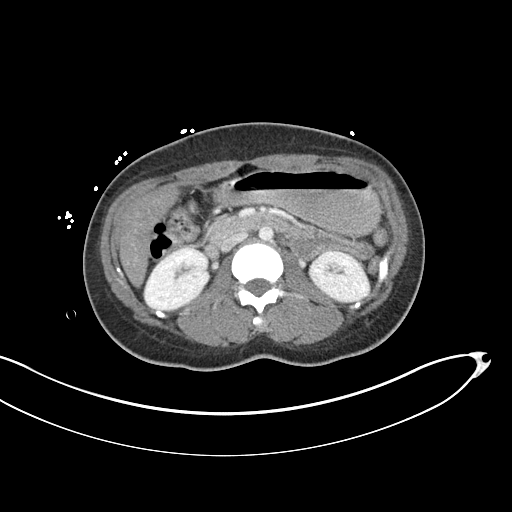
[im 66/96  soft-tissue]
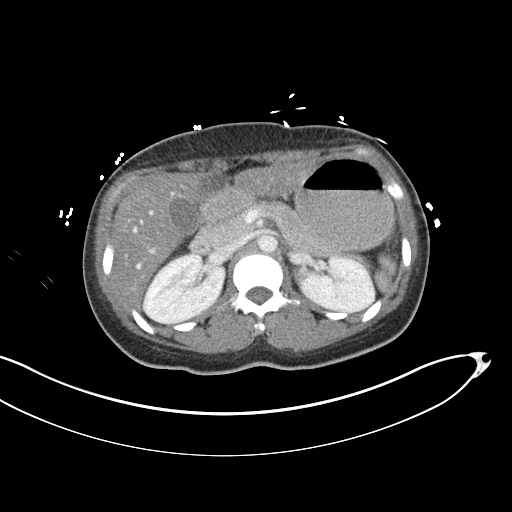
[im 66/96  bone]
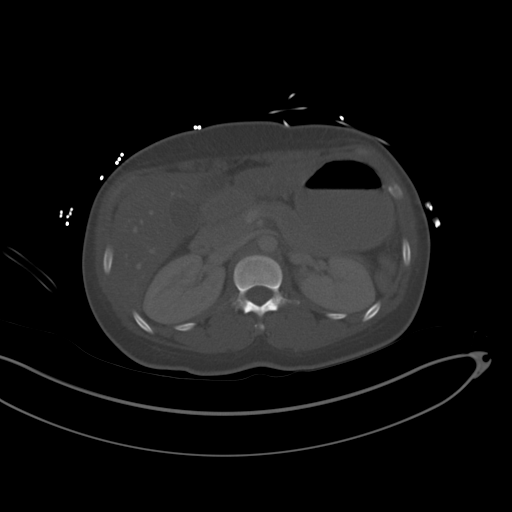
[im 72/96  soft-tissue]
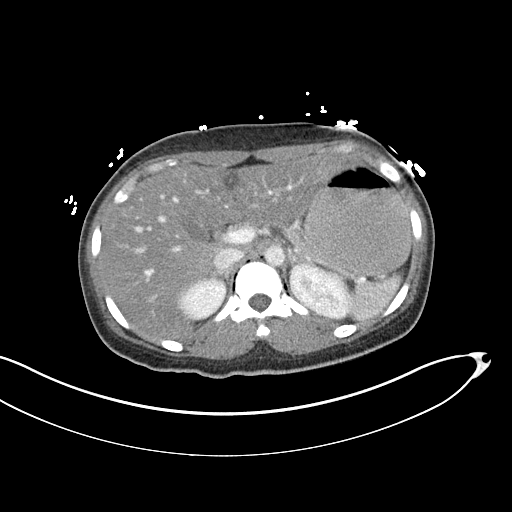
[im 84/96  soft-tissue]
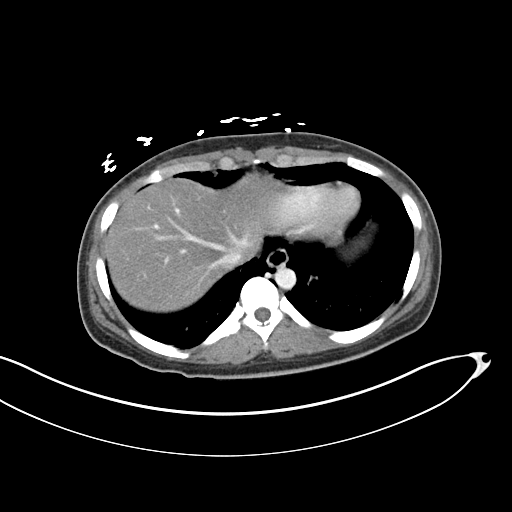
[im 90/96  soft-tissue]
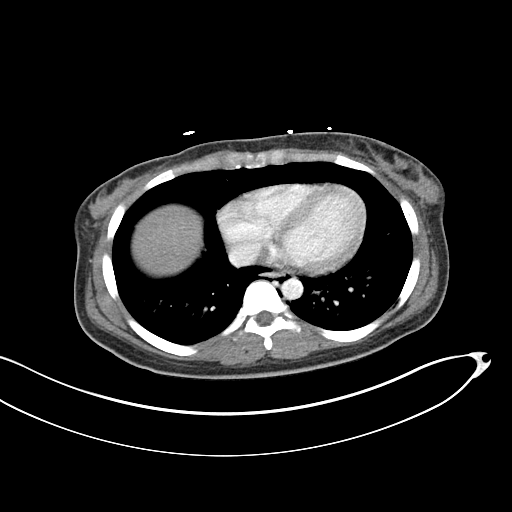

[Series 4: coronal st · coronal · 0.69mm/px · 3 of 107 slices shown]
[im 36/107  soft-tissue]
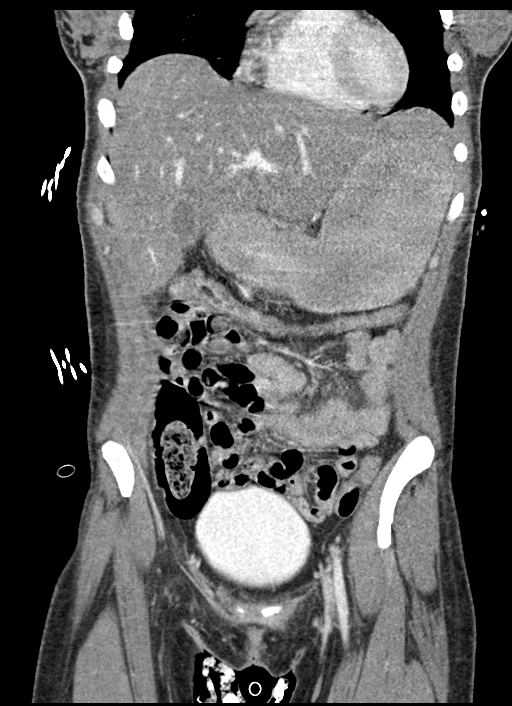
[im 48/107  soft-tissue]
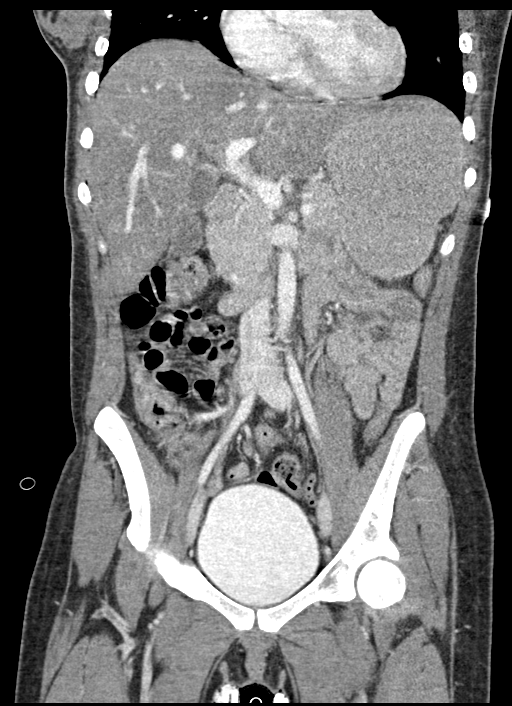
[im 59/107  soft-tissue]
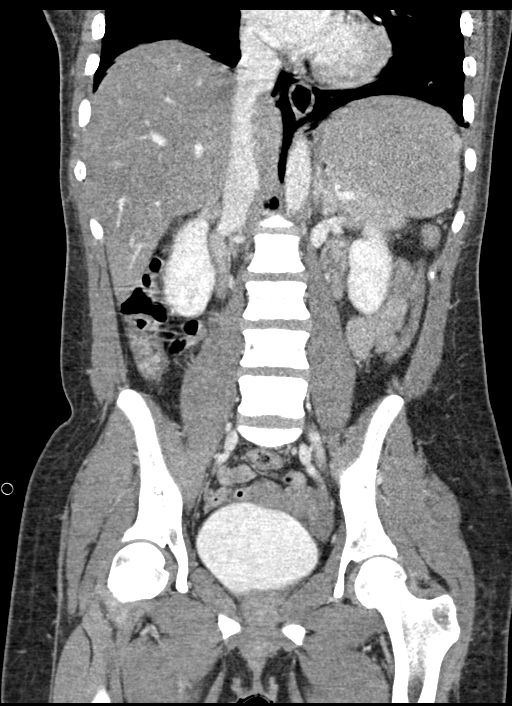

[15 of 46 positions shown; findings below may reference images not displayed]

FINDINGS: Lower chest: Extensive pneumomediastinum and probable
pneumopericardium better seen on the earlier chest CT.

No intra-abdominal free air or free fluid.

Hepatobiliary: Diffuse fatty infiltration of the liver. There is a 1
cm right hepatic cyst. No intrahepatic biliary dilatation. The
gallbladder is unremarkable

Pancreas: Unremarkable. No pancreatic ductal dilatation or
surrounding inflammatory changes.

Spleen: Normal in size without focal abnormality.

Adrenals/Urinary Tract: Adrenal glands are unremarkable. Kidneys are
normal, without renal calculi, focal lesion, or hydronephrosis.
Bladder is unremarkable.

Stomach/Bowel: The stomach is distended. No evidence of gastric
outlet obstruction. Diffuse thickened appearance of the colon,
likely related to underdistention. There is no bowel obstruction.
The appendix is normal.

Vascular/Lymphatic: The abdominal aorta and IVC unremarkable. No
portal venous gas. There is no adenopathy.

Reproductive: The uterus is anteverted. There is a 3 cm left ovarian
dominant follicle or corpus luteum. The right ovary is unremarkable.
High attenuating content in the vagina of indeterminate etiology. A
vesico vaginal fistula is less likely. Correlation with history of
recent procedure recommended.

Other: None

Musculoskeletal: No acute or significant osseous findings.
IMPRESSION: 1. No acute intra-abdominopelvic pathology. No bowel obstruction.
Normal appendix.
2. Fatty liver.
3. A 3 cm left ovarian dominant follicle or corpus luteum.
4. High attenuating content in the vagina of indeterminate etiology.
Clinical correlation is recommended.

## 2023-02-04 IMAGING — DX DG CHEST 1V PORT
1 series · 1 of 1 positions shown · non-contrast
Comparison: 01/04/2021.  CT 01/03/2021.

CLINICAL DATA: Pneumomediastinum.

EXAM:
PORTABLE CHEST 1 VIEW

[chest ap]
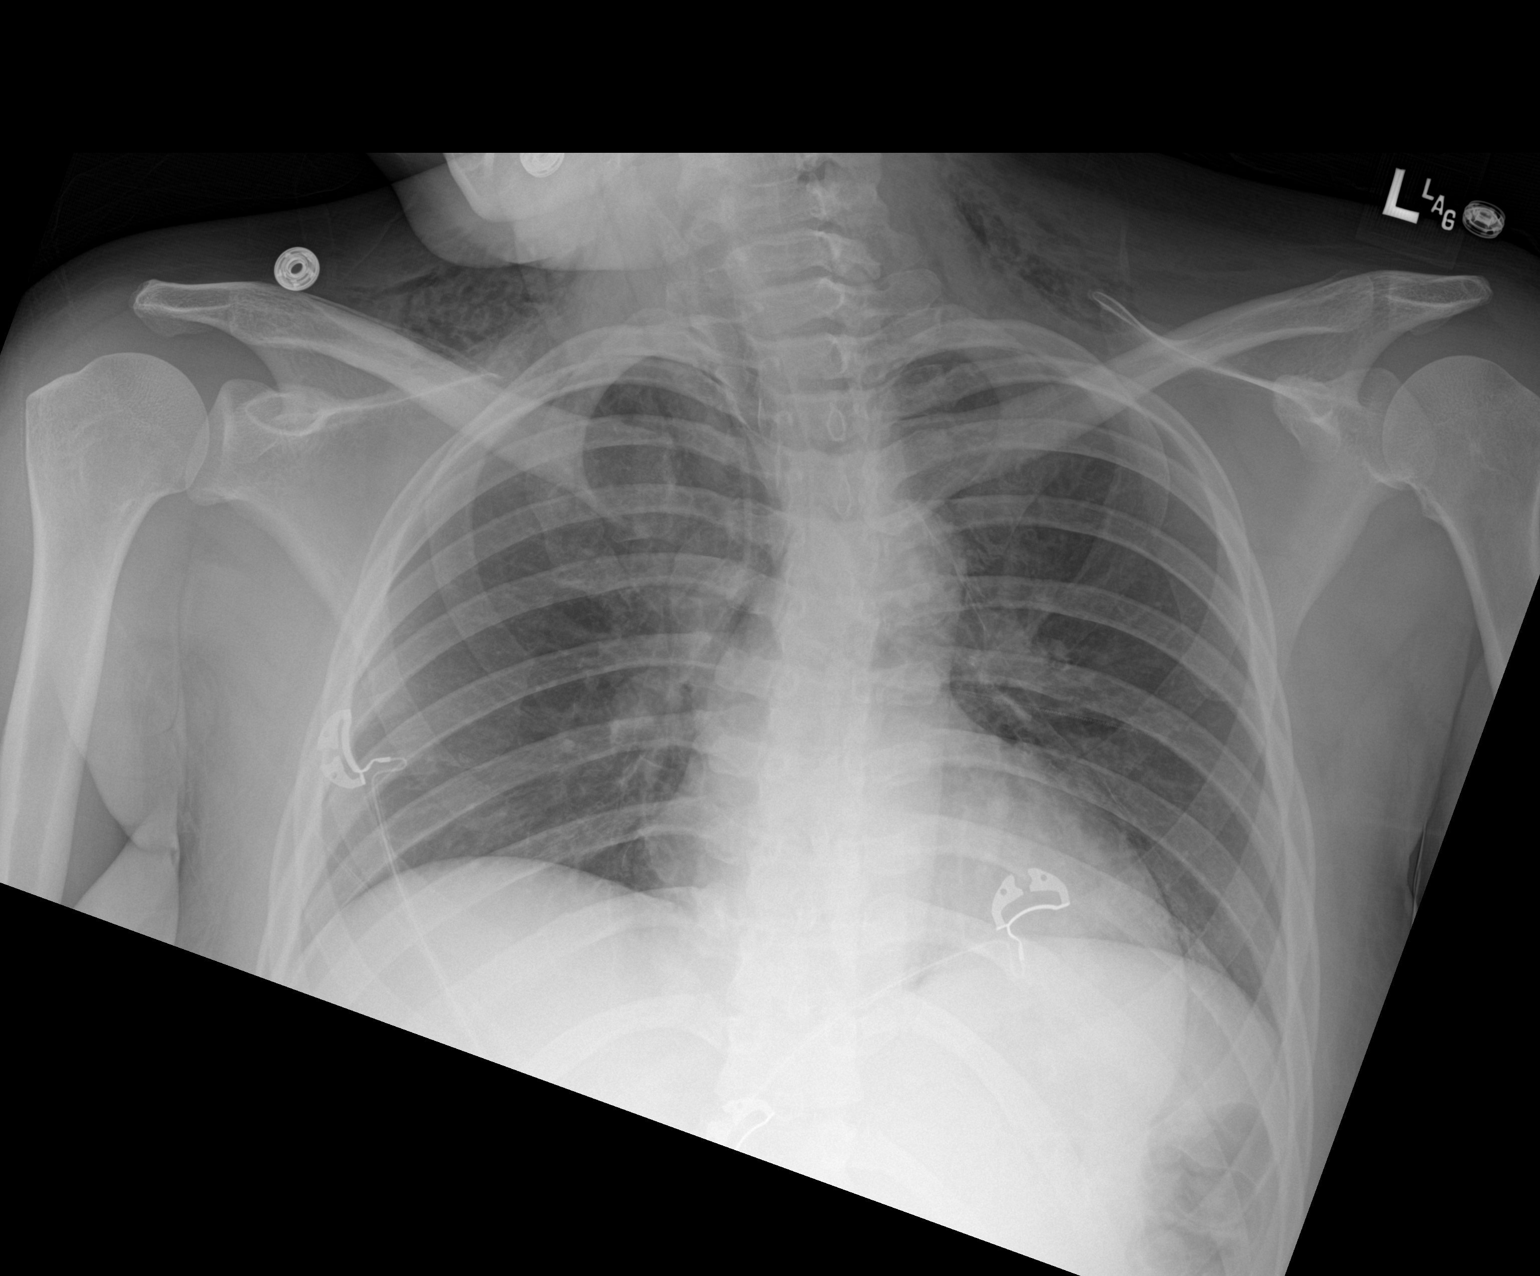

[1 of 1 positions shown; findings below may reference images not displayed]

FINDINGS: Pneumomediastinum and pneumopericardium again noted. No interim
change. Heart size normal. Low lung volumes. No focal infiltrate. No
pleural effusion or pneumothorax. Scoliosis thoracic spine. No acute
bony abnormality. Bilateral supraclavicular and neck subcutaneous
emphysema again noted. Similar findings noted on prior exam.
IMPRESSION: Pneumomediastinum and pneumopericardium again noted. No interim
change. Bilateral supraclavicular and neck subcutaneous emphysema
again noted. Similar findings noted on prior exam. Low lung volumes.
No focal infiltrate. Chest unchanged from prior exams.

## 2023-05-14 ENCOUNTER — Telehealth (HOSPITAL_COMMUNITY): Payer: Self-pay | Admitting: Licensed Clinical Social Worker

## 2023-05-14 NOTE — Telephone Encounter (Signed)
The therapist calls Diane Dickson confirming her identity via two identifiers. She was detoxed at [facility] from 04/26/23 through 05/01/23. She admits to drinking yesterday. She says that she got a shot when at [facility] which was likely Vivitrol.  Since being discharged, she has drunk every couple of days drinking half of a 5th per use. She smokes black-n-milds but denies any other use. She has no current legal charges.   [name] went to [facility] a couple years ago. Presently, she lives with her grandmother who does not drink. She does not attend AA meetings.   The therapist talks to her about the SA continuum of care with Arielle saying that she is interested in getting individual therapy and psychiatry and attending AA. As she does not know how to locate an AA meeting, the therapist makes her aware of [email or web address] encouraging her to do as close to 90 meetings in 90 days a possible. The therapist also provides her with his direct contact number.  Thus, she says she will come in for a CCA during one of the walk-in days and will attend AA and call this therapist back on a p.r.n. basis.  [address], MA, LCSW, [facility], LCAS 05/14/2023

## 2023-05-16 ENCOUNTER — Ambulatory Visit (HOSPITAL_COMMUNITY)
Admission: EM | Admit: 2023-05-16 | Discharge: 2023-05-16 | Disposition: A | Discharge status: Home / Self Care | Payer: Medicaid Other | Attending: Family | Admitting: Family

## 2023-05-16 ENCOUNTER — Ambulatory Visit (HOSPITAL_COMMUNITY): Payer: Medicaid Other | Admitting: Licensed Clinical Social Worker

## 2023-05-16 DIAGNOSIS — F1090 Alcohol use, unspecified, uncomplicated: Secondary | ICD-10-CM | POA: Insufficient documentation

## 2023-05-16 DIAGNOSIS — F1010 Alcohol abuse, uncomplicated: Secondary | ICD-10-CM

## 2023-05-16 DIAGNOSIS — F419 Anxiety disorder, unspecified: Secondary | ICD-10-CM

## 2023-05-16 DIAGNOSIS — F4310 Post-traumatic stress disorder, unspecified: Secondary | ICD-10-CM | POA: Insufficient documentation

## 2023-05-16 DIAGNOSIS — F329 Major depressive disorder, single episode, unspecified: Secondary | ICD-10-CM

## 2023-05-16 DIAGNOSIS — F319 Bipolar disorder, unspecified: Secondary | ICD-10-CM | POA: Insufficient documentation

## 2023-05-16 NOTE — Progress Notes (Signed)
   05/16/23 1302  [facility] Triage Screening (Walk-ins at [facility] only)  What Is the Reason for Your Visit/Call Today? Diane Dickson is a 27 y/o female presenting to the [facility]. She states, "I am a alcoholic". According to patient she received inpatient detox at [facility], June 13 to June 18. States that she was given discharge instructions to follow up with outpatient services such as group therapy or outpatient therapy. Patient says that she did not follow up with her discharge instructions as directed. Therefore, she started back drinking alcohol "a few days after her detox". States that during her inpatient admission she was given an injection (Vivitrol) to help with alcohol cravings. Although, she started drinking alcohol again she states that she noticed that she isn't drinking as much as she was prior to receiving detox. She thinks the Vivitrol is helping and would like another injection is possible. When asked about her average amt of use since discharge from [facility], she states, "Half a bottle of Casamigos". She states that previously she was drinking "a whole bottle all by myself". Her last drink of alcohol was today, "A few hrs before I came here today". Denies drug use. She denies a hx of seizures and DT's. Patient denies SI, HI, and AVH's. Patient lives with her grandmother. No children. She is unemployed. Her speech is slurred at times during today's triage.  How Long Has This Been Causing You Problems? > than 6 months  Have You Recently Had Any Thoughts About Hurting Yourself? No  Are You Planning to Commit Suicide/Harm Yourself At This time? No  Have you Recently Had Thoughts About Hurting Someone [name]? No  Are You Planning To Harm Someone At This Time? No  Are you currently experiencing any auditory, visual or other hallucinations? No  Have You Used Any Alcohol or Drugs in the Past 24 Hours? Yes  How long ago did you use Drugs or Alcohol? This morning, prior to arrival.  What Did You Use and How  Much? Patient states that she had 1/2 bottle of Casimigos  Do you have any current medical co-morbidities that require immediate attention? No  Clinician description of patient physical appearance/behavior: Patient appears impaired. Her speech is slurred.  What Do You Feel Would Help You the Most Today? Alcohol or Drug Use Treatment;Medication(s);Stress Management  If access to [facility] was not available, would you have sought care in the Emergency Department? No  Determination of Need Routine (7 days)  Options For Referral Medication Management;Facility-Based Crisis
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
## 2023-05-16 NOTE — ED Notes (Signed)
Patient discharged by provider Doran Heater, NP with written and verbal orders. Resources provided.

## 2023-05-16 NOTE — ED Provider Notes (Signed)
Behavioral Health Urgent Care Medical Screening Exam  Patient Name: Diane Dickson MRN: [id number] Date of Evaluation: 05/16/23 Chief Complaint:   Diagnosis:  Final diagnoses:  Alcohol use disorder    History of Present illness: Diane Dickson is a 27 y.o. female. Patient presents voluntarily to [facility] behavioral health for walk-in assessment.   Patient is assessed, face-to-face, by nurse practitioner. She is seated in assessment area, no acute distress. Consulted with provider, Dr.  Lucianne Muss, and chart reviewed on 05/16/2023. Patient  is alert and oriented, pleasant and cooperative during assessment.   Patient states "I had a drink today and my uncle brought me here, my family wants me to come here because I cannot drink like other people."  Patient reports using alcohol up to 4 times per week.  Using up to one half of 1/5 of liquor during each episode.  Most recent alcohol use earlier this date.  No history of alcohol-related seizure, no history of delirium tremens.  She denies substance use aside from alcohol.  Patient reports she relapsed on alcohol approximately 2 weeks ago after discharge from [facility].  [facility] related to alcohol use 04/26/2023 through 05/01/2023.  Patient reports she remained sober for a few days after discharge.  She endorses history of residential substance use treatment at [facility] 2 years ago.  She states "I felt like I was the only girl there, I do not want to go back to residential."  Recent stressors include her family who insist that she stop using alcohol.  Additional stressors include her boyfriend the patient describes "is a drug dealer." Patient is looking forward to moving to [place] to reside with her mother in approximately 2 weeks time.  Diane Dickson has been diagnosed with PTSD and bipolar 1 disorder.  She is not linked with outpatient psychiatry currently.  Plans to follow-up with outpatient psychiatry at [facility] behavioral  health outpatient.  No current medications.  She endorses multiple previous inpatient psychiatric hospitalizations.  No family mental health or addiction history reported.  Patient  presents with depressed mood, tearful affect. She  denies suicidal and homicidal ideations. Denies history of suicide attempts, denies history of non suicidal self-harm behavior.  Patient easily  contracts verbally for safety with this Clinical research associate.    Patient has normal speech and behavior.  She  denies auditory and visual hallucinations.  Patient is able to converse coherently with goal-directed thoughts and no distractibility or preoccupation.  Denies symptoms of paranoia.  Objectively there is no evidence of psychosis/mania or delusional thinking.  [name] resides in [place] with her grandmother. She is not currently employed.  She denies access to weapons.  Patient endorses average sleep and appetite.  Patient offered support and encouragement.  She gives verbal consent to speak with her mother, Erie Noe phone number [phone].  Spoke with patient's mother who reports concern that patient will relapse on alcohol.  Patient's mother states "this keeps happening over and over."  No safety concerns reported.   Patient and family are educated and verbalize understanding of mental health resources and other crisis services in the community. They are instructed to call 911 and present to the nearest emergency room should patient experience any suicidal/homicidal ideation, auditory/visual/hallucinations, or detrimental worsening of mental health condition.       Flowsheet Row ED from 05/16/2023 in [facility] ED from 02/28/2022 in [facility] ED from 12/22/2021 in [facility]  C-SSRS RISK CATEGORY  No Risk No Risk No Risk       Psychiatric Specialty Exam  Presentation  General Appearance:Appropriate for Environment; Casual  Eye  Contact:Good  Speech:Clear and Coherent; Normal Rate  Speech Volume:Normal  Handedness:Right   Mood and Affect  Mood: Euthymic  Affect: Appropriate; Congruent   Thought Process  Thought Processes: Coherent; Goal Directed; Linear  Descriptions of Associations:Intact  Orientation:Full (Time, Place and Person)  Thought Content:Logical; WDL  Diagnosis of Schizophrenia or Schizoaffective disorder in past: No data recorded  Hallucinations:None  Ideas of Reference:None  Suicidal Thoughts:No  Homicidal Thoughts:No   Sensorium  Memory: Immediate Good; Recent Good  Judgment: Good  Insight: Fair   Executive Functions  Concentration: Good  Attention Span: Good  Recall: Good  Fund of Knowledge: Good  Language: Good   Psychomotor Activity  Psychomotor Activity: Normal   Assets  Assets: Communication Skills; Desire for Improvement; Financial Resources/Insurance; Housing; Resilience; Social Support; Physical Health   Sleep  Sleep: Good  Number of hours: No data recorded  Physical Exam: Physical Exam Vitals and nursing note reviewed.  Constitutional:      Appearance: Normal appearance. She is well-developed and normal weight.  HENT:     Head: Normocephalic and atraumatic.     Nose: Nose normal.  Cardiovascular:     Rate and Rhythm: Normal rate.  Pulmonary:     Effort: Pulmonary effort is normal.  Musculoskeletal:        General: Normal range of motion.     Cervical back: Normal range of motion.  Skin:    General: Skin is warm and dry.  Neurological:     Mental Status: She is alert and oriented to person, place, and time.  Psychiatric:        Attention and Perception: Attention and perception normal.        Mood and Affect: Mood is depressed. Affect is tearful.        Speech: Speech normal.        Behavior: Behavior normal. Behavior is cooperative.        Thought Content: Thought content normal.        Cognition and Memory:  Cognition normal.    Review of Systems  Constitutional: Negative.   HENT: Negative.    Eyes: Negative.   Respiratory: Negative.    Cardiovascular: Negative.   Gastrointestinal: Negative.   Genitourinary: Negative.   Musculoskeletal: Negative.   Skin: Negative.   Neurological: Negative.   Psychiatric/Behavioral:  Positive for depression and substance abuse.    Blood pressure 119/85, pulse 78, temperature 98.7 F (37.1 C), temperature source Oral, resp. rate 18, SpO2 99 %. There is no height or weight on file to calculate BMI.  Musculoskeletal: Strength & Muscle Tone: within normal limits Gait & Station: normal Patient leans: N/A   [facility] MSE Discharge Disposition for Follow up and Recommendations: Based on my evaluation the patient does not appear to have an emergency medical condition and can be discharged with resources and follow up care in outpatient services for Medication Management, Substance Abuse Intensive Outpatient Program, and Individual Therapy Follow up with outpatient psychiatry, resources provided. Follow up with substance use tx. A referral has been initiated on your behalf for substance abuse intensive outpatient program.  Please meet with Hessie Diener at 2 PM today to complete evaluation prior to joining program.  Lenard Lance, FNP 05/16/2023, 2:01 PM

## 2023-05-16 NOTE — Discharge Instructions (Addendum)
Patient is instructed prior to discharge to:  Take all medications as prescribed by his/her mental healthcare provider. Report any adverse effects and or reactions from the medicines to his/her outpatient provider promptly. Keep all scheduled appointments, to ensure that you are getting refills on time and to avoid any interruption in your medication.  If you are unable to keep an appointment call to reschedule.  Be sure to follow-up with resources and follow-up appointments provided.  Patient has been instructed & cautioned: To not engage in alcohol and or illegal drug use while on prescription medicines. In the event of worsening symptoms, patient is instructed to call the crisis hotline, 911 and or go to the nearest ED for appropriate evaluation and treatment of symptoms. To follow-up with his/her primary care provider for your other medical issues, concerns and or health care needs.  Information: -National Suicide Prevention Lifeline 1-800-SUICIDE or 628-724-8768.  -988 offers 24/7 access to trained crisis counselors who can help people experiencing mental health-related distress. People can call or text 988 or chat 988lifeline.org for themselves or if they are worried about a loved one who may need crisis support.     Patient is instructed prior to discharge to:  Take all medications as prescribed by his/her mental healthcare provider. Report any adverse effects and or reactions from the medicines to his/her outpatient provider promptly. Keep all scheduled appointments, to ensure that you are getting refills on time and to avoid any interruption in your medication.  If you are unable to keep an appointment call to reschedule.  Be sure to follow-up with resources and follow-up appointments provided.  Patient has been instructed & cautioned: To not engage in alcohol and or illegal drug use while on prescription medicines. In the event of worsening symptoms, patient is instructed to call the  crisis hotline, 911 and or go to the nearest ED for appropriate evaluation and treatment of symptoms. To follow-up with his/her primary care provider for your other medical issues, concerns and or health care needs.  Information: -National Suicide Prevention Lifeline 1-800-SUICIDE or 6011741753.  -988 offers 24/7 access to trained crisis counselors who can help people experiencing mental health-related distress. People can call or text 988 or chat 988lifeline.org for themselves or if they are worried about a loved one who may need crisis support.

## 2023-11-06 ENCOUNTER — Emergency Department (HOSPITAL_BASED_OUTPATIENT_CLINIC_OR_DEPARTMENT_OTHER)
Admission: EM | Admit: 2023-11-06 | Discharge: 2023-11-06 | Disposition: A | Discharge status: Home / Self Care | Payer: Medicaid Other | Attending: Emergency Medicine | Admitting: Emergency Medicine

## 2023-11-06 DIAGNOSIS — T5191XA Toxic effect of unspecified alcohol, accidental (unintentional), initial encounter: Secondary | ICD-10-CM | POA: Diagnosis present

## 2023-11-06 DIAGNOSIS — Z79899 Other long term (current) drug therapy: Secondary | ICD-10-CM | POA: Diagnosis not present

## 2023-11-06 LAB — CBG MONITORING, ED: Glucose-Capillary: 83 mg/dL (ref 70–99)

## 2023-11-06 NOTE — Discharge Instructions (Signed)
Resources on alcohol use disorder is provided below. Please continue seeing the therapist as you intended. We wish you the very best.  Substance Abuse Treatment Programs  Intensive Outpatient Programs [facility]     [address]      [place], [place]                   [phone]       [facility] [address] [place], [place] [phone]  [facility]     (Inpatient and outpatient)     [address]           [phone]    [facility] [phone] (Suboxone and Methadone)  [address]      [place], [place] [address]      [phone]       [address] [place], [place] [phone]  [facility] (Outpatient/Inpatient, [employer])    (insurance only) [phone]             [facility] (Groups & Residential) [place], [place] [phone]     [facility]     [address]     [place], [place]      [phone]       [facility] (for caregivers and family) [phone] [address] [place], [place] [phone]      Residential Treatment Programs [facility]      [address], [place], [place] [address]  [phone]       [facility] [address] [place], [place] [address] [phone]  [facility] [place]        [phone]       [facility] [phone]  [facility] ([facility]             [address]                                         [place], [place]                                                [phone] or [phone]                               [facility] [address] [place], [address] [phone]  [facility]    [address]      [place], [place]     [phone]       [facility] [address] [place], [place] [phone]  [facility]   [address] [place], [place]  [address]     [phone]      Admissions: 8am-3pm M-F  [facility] ([facility]) [address] [place], [place] [phone]  [facility]: Residential Program [phone] Days)   [place], [place]      [phone] or [phone]     [facility]: [facility] [place], [place] (Walk in Hours over the weekend or by referral)  [facility] [address] [place], [place], [place] [address] [phone]  Crisis Mobile: [facility]:  [phone] (for crisis  response 24 hours a day) [facility] Hotline:      682-823-2054 Outpatient Psychiatry and Counseling  [facility]: Mobile Crisis Management 24 hours:  [phone]  [facility] of the [employer] sliding scale fee and walk in schedule: M-F 8am-12pm/1pm-3pm [address]  [place], [place] [address] [phone]  [facility] [address] [place], [place] [address] [phone]  [facility] (Formerly known as [facility] [employer])- new patient walk-in appointments available Monday - Friday 8am -3pm.          [address] [place], [place] [address] [phone] or crisis line- [phone]  [facility] Out[facility] Services/ Intensive Outpatient Therapy Program [address] [place], [place] [address] [phone]  [facility]                  Crisis Services      [phone] [address]     [place], [place] [address]                 [facility]   [facility] [phone]. [address] [place], [place] [address]   [facility] [facility]          [address]  [place], [place] [address]       [phone]  [facility] [address] [place], [place] [address] [phone]  [facility]    [address]    [place], [place] [address]     [phone]       Andee Poles,  MD     [address]     [place] [address]     [phone]       [facility] [address] [place] [address]  [facility]     [address] [place], [place]      [phone]       [facility] Eulogio Ditch, MD [address] [place], [place] [address] [phone]  [facility]     [address]     [place], [place] [address]     [phone]       [facility] [address] [place], [place] [address] [phone] Resources for Temporary Residential Assistance/Crisis Centers  DAY CENT[facility]) M-F 8am-3pm   [address] [place], [place] [address]   [phone] Services include: laundry, barbering, support groups, case management, phone  & computer access, showers, AA/NA mtgs, mental health/substance abuse nurse, job skills class, disability information, VA assistance, spiritual classes, etc.   HOMELESS SHELTERS  [facility]     [facility]   [address], [place]     [phone]              [employer] (women and children)       [address] [place], [place] [address] [phone] [email or web address] for application and process Application Required  [facility] [employer] [facility]   [address]    [place] [address]     [phone]                    [facility] of [place] [address] [place], [place] [address] [phone] application appt.) Application Required  [employer] (women only)  [address]     [place], [place] [address]     [phone]      Intake starts 6pm daily Need valid ID, SSC, & Police report [employer] [address] [place], [place] [phone] Application Required  [employer] (men only)     [address]      [place],  [place]     [phone]       [facility] of the [place] (Pregnant women only) [address] [place], [place] [phone]  [facility]      [address]      [place], [place] [address]     [phone]             [facility] [address] [place], [place] [phone] 90 day commitment/SA/Application process  [facility](men only)     [address]     [place], [place]     [phone]       Check-in at [facility] of [facility] [address] [place], [place] [address] [phone] Men/Women/Women and Children must be there by 7 pm  [facility] [place], [place] [phone]

## 2023-11-06 NOTE — ED Triage Notes (Signed)
Patient arrived to ED in [place] unconscious. Driver was deaf and mute unable to give background. Patient became more aroused after getting in wheelchair. Patient appears intoxicated and had half empty bottle of tequila in pocketbook. Uncooperative in triage.

## 2023-11-06 NOTE — ED Provider Notes (Signed)
[place] EMERGENCY DEPARTMENT AT [facility] Provider Note   CSN: [id number] Arrival date & time: 11/06/23  1737     History  Chief Complaint  Patient presents with   Altered Mental Status    Diane Dickson is a 27 y.o. female.  HPI    Pt comes in with cc of altered mental status. Patient brought to the emergency room by her friend.  Translation service was utilized for this encounter, as the friend is hearing impaired.  According to the patient's friend, Aurelio Brash, patient called her to come pick her up.  They met a year ago when he was driving Pharmacist, community.  They have been fine since then.  He occasionally helps her.  Once he got there, they had a chat for little bit.  Once patient got in the back of his car, she passed out.  He pulled over, tried to get her attention but patient was not responding.  She was still breathing spontaneously and had a carotid pulse.  Patient was not waking up, therefore he decided to bring her to the ER.  Once in the ER, patient over time became responsive.  She is unsure why she was brought in the emergency room and upset about it.  She tells me that she has had a rough upbringing and is alcoholic.  She calls joey because she feels safe talking to him.  She had heavy alcohol use earlier today.  According to her friend, she had open alcohol bottle of tequila in the back of the car.  Patient denies any substance use disorder.  Patient wants to be discharged.  She has no HI, SI.  Her friend is comfortable taking her home, now that she is acting normally.  Home Medications Prior to Admission medications   Medication Sig Start Date End Date Taking? Authorizing Provider  escitalopram (LEXAPRO) 10 MG tablet Take 1 tablet (10 mg total) by mouth daily. 12/25/21   Ardis Hughs, NP  hydrochlorothiazide (HYDRODIURIL) 12.5 MG tablet Take 1 tablet (12.5 mg total) by mouth daily. 12/25/21   Ardis Hughs, NP  hydrOXYzine (ATARAX) 25 MG tablet Take 1 tablet  (25 mg total) by mouth 3 (three) times daily as needed for anxiety. 12/25/21   Ardis Hughs, NP  Multiple Vitamin (MULTIVITAMIN WITH MINERALS) TABS tablet Take 1 tablet by mouth daily. 03/03/22   Karsten Ro, MD  thiamine 100 MG tablet Take 1 tablet (100 mg total) by mouth daily. 03/03/22   Karsten Ro, MD  traZODone (DESYREL) 50 MG tablet Take 1 tablet (50 mg total) by mouth at bedtime as needed for sleep. 12/25/21   Ardis Hughs, NP      Allergies    Patient has no known allergies.    Review of Systems   Review of Systems  All other systems reviewed and are negative.   Physical Exam Updated Vital Signs BP (!) 132/104 (BP Location: Left Arm)   Pulse 99   Temp 98.2 F (36.8 C) (Oral)   Resp 17   Wt 59 kg   SpO2 100%   BMI 25.40 kg/m  Physical Exam Vitals and nursing note reviewed.  Constitutional:      Appearance: She is well-developed.  HENT:     Head: Atraumatic.  Eyes:     Extraocular Movements: Extraocular movements intact.  Cardiovascular:     Rate and Rhythm: Normal rate.  Pulmonary:     Effort: Pulmonary effort is normal.  Musculoskeletal:  Cervical back: Normal range of motion.  Skin:    General: Skin is warm and dry.  Neurological:     Mental Status: She is alert and oriented to person, place, and time.     Sensory: No sensory deficit.     Motor: No weakness.     Gait: Gait normal.     ED Results / Procedures / Treatments   Labs (all labs ordered are listed, but only abnormal results are displayed) Labs Reviewed  CBG MONITORING, ED    EKG None  Radiology No results found.  Procedures Procedures    Medications Ordered in ED Medications - No data to display  ED Course/ Medical Decision Making/ A&P                                 Medical Decision Making  27 year old female comes in with chief complaint of altered mental status.  When she first arrived, she was obtunded and required sternal rub.  Now she is awake, upset  that she is in the emergency room.  She is not sure why she was brought here.  She admits to alcohol use disorder and drinking heavily today.  She states that she was upset and called her friend, so that she can chat with him.  She admits to heavy drinking today.  There was half empty tequila bottle in the back of the car as well.  Patient currently alert, oriented and coherent.  She is ambulating well.  She does not want to stay in the ER.  She wants to be discharged.  Her friend is comfortable taking her home.  There is no signs of trauma.    Final Clinical Impression(s) / ED Diagnoses Final diagnoses:  Toxic effect of unsp alcohol, accidental, init    Rx / DC Orders ED Discharge Orders     None         Derwood Kaplan, MD 11/06/23 1955

## 2024-01-17 DIAGNOSIS — D72819 Decreased white blood cell count, unspecified: Secondary | ICD-10-CM | POA: Insufficient documentation

## 2024-01-17 DIAGNOSIS — R748 Abnormal levels of other serum enzymes: Secondary | ICD-10-CM | POA: Insufficient documentation

## 2024-04-28 DIAGNOSIS — A599 Trichomoniasis, unspecified: Secondary | ICD-10-CM | POA: Insufficient documentation

## 2024-08-15 ENCOUNTER — Emergency Department (HOSPITAL_BASED_OUTPATIENT_CLINIC_OR_DEPARTMENT_OTHER)
Admission: EM | Admit: 2024-08-15 | Discharge: 2024-08-15 | Disposition: A | Discharge status: Home / Self Care | Attending: Emergency Medicine | Admitting: Emergency Medicine

## 2024-08-15 DIAGNOSIS — N939 Abnormal uterine and vaginal bleeding, unspecified: Secondary | ICD-10-CM | POA: Insufficient documentation

## 2024-08-15 DIAGNOSIS — R110 Nausea: Secondary | ICD-10-CM | POA: Insufficient documentation

## 2024-08-15 DIAGNOSIS — Z79899 Other long term (current) drug therapy: Secondary | ICD-10-CM | POA: Diagnosis not present

## 2024-08-15 DIAGNOSIS — I10 Essential (primary) hypertension: Secondary | ICD-10-CM | POA: Insufficient documentation

## 2024-08-15 LAB — CBC WITH DIFFERENTIAL/PLATELET
Abs Immature Granulocytes: 0.01 K/uL (ref 0.00–0.07)
Basophils Absolute: 0 K/uL (ref 0.0–0.1)
Basophils Relative: 1 %
Eosinophils Absolute: 0 K/uL (ref 0.0–0.5)
Eosinophils Relative: 1 %
HCT: 40.4 % (ref 36.0–46.0)
Hemoglobin: 13.6 g/dL (ref 12.0–15.0)
Immature Granulocytes: 0 %
Lymphocytes Relative: 27 %
Lymphs Abs: 1.1 K/uL (ref 0.7–4.0)
MCH: 31.9 pg (ref 26.0–34.0)
MCHC: 33.7 g/dL (ref 30.0–36.0)
MCV: 94.8 fL (ref 80.0–100.0)
Monocytes Absolute: 0.5 K/uL (ref 0.1–1.0)
Monocytes Relative: 12 %
Neutro Abs: 2.4 K/uL (ref 1.7–7.7)
Neutrophils Relative %: 59 %
Platelets: 332 K/uL (ref 150–400)
RBC: 4.26 MIL/uL (ref 3.87–5.11)
RDW: 13.9 % (ref 11.5–15.5)
WBC: 4 K/uL (ref 4.0–10.5)
nRBC: 0 % (ref 0.0–0.2)

## 2024-08-15 LAB — URINALYSIS, ROUTINE W REFLEX MICROSCOPIC
Bacteria, UA: NONE SEEN
Glucose, UA: NEGATIVE mg/dL
Ketones, ur: >80 mg/dL — AB
Nitrite: NEGATIVE
Protein, ur: 30 mg/dL — AB
Specific Gravity, Urine: 1.033 — ABNORMAL HIGH (ref 1.005–1.030)
pH: 6 (ref 5.0–8.0)

## 2024-08-15 LAB — WET PREP, GENITAL
Clue Cells Wet Prep HPF POC: NONE SEEN
Sperm: NONE SEEN
Trich, Wet Prep: NONE SEEN
WBC, Wet Prep HPF POC: <10 (ref <10)
Yeast Wet Prep HPF POC: NONE SEEN

## 2024-08-15 LAB — PREGNANCY, URINE: Preg Test, Ur: NEGATIVE

## 2024-08-15 LAB — HCG, SERUM, QUALITATIVE: Preg, Serum: NEGATIVE

## 2024-08-15 MED ORDER — ONDANSETRON 4 MG PO TBDP: 4.0000 mg | ORAL_TABLET | Freq: Three times a day (TID) | ORAL | 0 refills | Status: DC | PRN

## 2024-08-15 MED ORDER — ONDANSETRON 4 MG PO TBDP
4.0000 mg | ORAL_TABLET | Freq: Once | ORAL | Status: AC
  Administered 2024-08-15: 4 mg via ORAL
  Filled 2024-08-15: qty 1

## 2024-08-15 MED ORDER — MEGESTROL ACETATE 20 MG PO TABS: 40.0000 mg | ORAL_TABLET | Freq: Two times a day (BID) | ORAL | 0 refills | Status: AC

## 2024-08-15 MED ORDER — ONDANSETRON 4 MG PO TBDP
4.0000 mg | ORAL_TABLET | Freq: Once | ORAL | Status: AC
  Administered 2024-08-15: 4 mg via ORAL

## 2024-08-15 NOTE — ED Provider Notes (Signed)
 [place] EMERGENCY DEPARTMENT AT [facility] Provider Note   CSN: [id number] Arrival date & time: 08/15/24  1659     Patient presents with: Vaginal Bleeding   Diane Dickson is a 28 y.o. female.  Patient with past history significant for hypertension, pneumomediastinum presents the emergency department with concerns of vaginal bleeding.  Patient reports that she has been having abnormal bleeding since 9/15 has had some intermittent abdominal cramping.  Endorses some nausea and vomiting but states that she is primarily nauseous.  No reported diarrhea or abdominal pain beyond the intermittent cramping that she has noted.   Vaginal Bleeding      Prior to Admission medications   Medication Sig Start Date End Date Taking? Authorizing Provider  megestrol (MEGACE) 20 MG tablet Take 2 tablets (40 mg total) by mouth 2 (two) times daily for 5 days. 08/15/24 08/20/24 Yes Barry Culverhouse A, PA-C  ondansetron  (ZOFRAN -ODT) 4 MG disintegrating tablet Take 1 tablet (4 mg total) by mouth every 8 (eight) hours as needed for nausea or vomiting. 08/15/24  Yes Raelle Chambers A, PA-C  escitalopram  (LEXAPRO ) 10 MG tablet Take 1 tablet (10 mg total) by mouth daily. 12/25/21   Mardy Elveria DEL, NP  hydrochlorothiazide  (HYDRODIURIL ) 12.5 MG tablet Take 1 tablet (12.5 mg total) by mouth daily. 12/25/21   Mardy Elveria DEL, NP  hydrOXYzine  (ATARAX ) 25 MG tablet Take 1 tablet (25 mg total) by mouth 3 (three) times daily as needed for anxiety. 12/25/21   Mardy Elveria DEL, NP  Multiple Vitamin (MULTIVITAMIN WITH MINERALS) TABS tablet Take 1 tablet by mouth daily. 03/03/22   Doda, Vandana, MD  thiamine  100 MG tablet Take 1 tablet (100 mg total) by mouth daily. 03/03/22   Doda, Vandana, MD  traZODone  (DESYREL ) 50 MG tablet Take 1 tablet (50 mg total) by mouth at bedtime as needed for sleep. 12/25/21   Mardy Elveria DEL, NP    Allergies: Patient has no known allergies.    Review of Systems  Genitourinary:   Positive for vaginal bleeding.  All other systems reviewed and are negative.   Updated Vital Signs BP (!) 147/102 (BP Location: Right Arm)   Pulse 96   Temp 99.1 F (37.3 C) (Oral)   Resp 16   SpO2 100%   Physical Exam Vitals and nursing note reviewed. Exam conducted with a chaperone present.  Constitutional:      General: She is not in acute distress.    Appearance: She is well-developed.  HENT:     Head: Normocephalic and atraumatic.  Eyes:     Conjunctiva/sclera: Conjunctivae normal.  Cardiovascular:     Rate and Rhythm: Normal rate and regular rhythm.     Heart sounds: No murmur heard. Pulmonary:     Effort: Pulmonary effort is normal. No respiratory distress.     Breath sounds: Normal breath sounds.  Abdominal:     Palpations: Abdomen is soft.     Tenderness: There is no abdominal tenderness.  Genitourinary:    General: Normal vulva.     Vagina: Normal. No vaginal discharge.     Cervix: Cervical bleeding present. No cervical motion tenderness.     Adnexa: Right adnexa normal and left adnexa normal.       Right: No tenderness.         Left: No tenderness.    Musculoskeletal:        General: No swelling.     Cervical back: Neck supple.  Skin:    General: Skin  is warm and dry.     Capillary Refill: Capillary refill takes less than 2 seconds.  Neurological:     Mental Status: She is alert.  Psychiatric:        Mood and Affect: Mood normal.     (all labs ordered are listed, but only abnormal results are displayed) Labs Reviewed  URINALYSIS, ROUTINE W REFLEX MICROSCOPIC - Abnormal; Notable for the following components:      Result Value   Specific Gravity, Urine 1.033 (*)    Hgb urine dipstick MODERATE (*)    Bilirubin Urine SMALL (*)    Ketones, ur >80 (*)    Protein, ur 30 (*)    Leukocytes,Ua TRACE (*)    All other components within normal limits  WET PREP, GENITAL  CBC WITH DIFFERENTIAL/PLATELET  PREGNANCY, URINE  HCG, SERUM, QUALITATIVE   GC/CHLAMYDIA PROBE AMP ([place]) NOT AT [facility]    EKG: None  Radiology: No results found.   Procedures   Medications Ordered in the ED  ondansetron  (ZOFRAN -ODT) disintegrating tablet 4 mg (has no administration in time range)  ondansetron  (ZOFRAN -ODT) disintegrating tablet 4 mg (4 mg Oral Given 08/15/24 1709)                                    Medical Decision Making Amount and/or Complexity of Data Reviewed Labs: ordered.  Risk Prescription drug management.   This patient presents to the ED for concern of vaginal bleeding.  Differential diagnosis includes abnormal uterine bleeding, PID, miscarriage.   Lab Tests:  I Ordered, and personally interpreted labs.  The pertinent results include: CBC unremarkable, urine pregnancy negative, UA without obvious signs of infection and likely blood present due to vaginal bleeding, hCG negative, wet prep negative, gonorrhea/chlamydia pending   Medicines ordered and prescription drug management:  I ordered medication including Zofran  for nausea Reevaluation of the patient after these medicines showed that the patient improved I have reviewed the patients home medicines and have made adjustments as needed   Problem List / ED Course:  Patient presents to the emergency department concerns of vaginal bleeding.  Reports has been ongoing for approximately 2 days to 3 weeks.  She does admit that she is sexually active but does not have any concerns for pregnancy.  She is not current on any birth control.  Denies any significant abdominal pain but has had some lower abdominal cramping on occasion that she reports feels somewhat similar to her menstrual cramping. On exam, patient has no focal abdominal tenderness.  Chaperoned exam reveals bleeding in the vaginal vault but no vaginal discharge or drainage.  There is no cervical motion tenderness or adnexal tenderness. Lab results are unremarkable.  Hemoglobin is stable at 13.6 with no  indication for transfusion.  Suspect this is likely some form of menorrhagia.  With lack of abdominal pain, do not feel the patient requires emergent imaging or ultrasound at this time.  Will start patient on Megace to help address vaginal bleeding and encourage close follow-up with PCP/OB/GYN.  Otherwise stable this time for outpatient follow-up and discharged home.   Social Determinants of Health:  None   Final diagnoses:  Abnormal uterine bleeding  Nausea    ED Discharge Orders          Ordered    megestrol (MEGACE) 20 MG tablet  2 times daily        08/15/24 2336  ondansetron  (ZOFRAN -ODT) 4 MG disintegrating tablet  Every 8 hours PRN        08/15/24 2336               Anandi Abramo A, PA-C 08/15/24 2341    Pamella Ozell LABOR, DO 08/21/24 1731

## 2024-08-15 NOTE — ED Notes (Signed)
 Additional labs sent for HCG serum qual/quant if needed.

## 2024-08-15 NOTE — Discharge Instructions (Addendum)
 You are seen in the emergency department today for concerns of vaginal bleeding.  Your labs and imaging were thankfully reassuring.  I do suspect likely abnormal bleeding and would recommend initiating treatment to help reduce the bleeding.  He will take this medication for the next 5 days, this is called Megace.  You should follow-up with your OB/GYN or primary care provider thereafter.  For any concerns of new or worsening symptoms, return to the emergency department.  I have also sent a prescription for Zofran  for nausea for you.  Please take this as prescribed.

## 2024-08-15 NOTE — ED Triage Notes (Signed)
 Dysmenorrhea since 9/15. Abd cramping pain. Fatigue. Nausea and emesis today.

## 2024-08-18 LAB — GC/CHLAMYDIA PROBE AMP (~~LOC~~) NOT AT ARMC
Chlamydia: NEGATIVE
Comment: NEGATIVE
Comment: NORMAL
Neisseria Gonorrhea: NEGATIVE

## 2024-10-03 ENCOUNTER — Emergency Department (HOSPITAL_BASED_OUTPATIENT_CLINIC_OR_DEPARTMENT_OTHER): Admitting: Radiology

## 2024-10-03 ENCOUNTER — Encounter (HOSPITAL_BASED_OUTPATIENT_CLINIC_OR_DEPARTMENT_OTHER): Payer: Self-pay

## 2024-10-03 ENCOUNTER — Emergency Department (HOSPITAL_BASED_OUTPATIENT_CLINIC_OR_DEPARTMENT_OTHER)

## 2024-10-03 ENCOUNTER — Emergency Department (HOSPITAL_BASED_OUTPATIENT_CLINIC_OR_DEPARTMENT_OTHER)
Admission: EM | Admit: 2024-10-03 | Discharge: 2024-10-03 | Disposition: A | Discharge status: Home / Self Care | Attending: Emergency Medicine | Admitting: Emergency Medicine

## 2024-10-03 ENCOUNTER — Other Ambulatory Visit: Payer: Self-pay

## 2024-10-03 DIAGNOSIS — I10 Essential (primary) hypertension: Secondary | ICD-10-CM | POA: Diagnosis not present

## 2024-10-03 DIAGNOSIS — M79641 Pain in right hand: Secondary | ICD-10-CM | POA: Diagnosis present

## 2024-10-03 MED ORDER — OXYCODONE-ACETAMINOPHEN 5-325 MG PO TABS
1.0000 | ORAL_TABLET | Freq: Once | ORAL | Status: AC
  Administered 2024-10-03: 1 via ORAL
  Filled 2024-10-03: qty 1

## 2024-10-03 MED ORDER — ONDANSETRON 4 MG PO TBDP
4.0000 mg | ORAL_TABLET | Freq: Once | ORAL | Status: AC
  Administered 2024-10-03: 4 mg via ORAL
  Filled 2024-10-03: qty 1

## 2024-10-03 NOTE — ED Notes (Signed)
 Patient transported to CT
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
## 2024-11-22 ENCOUNTER — Encounter (HOSPITAL_BASED_OUTPATIENT_CLINIC_OR_DEPARTMENT_OTHER): Payer: Self-pay | Admitting: Emergency Medicine

## 2024-11-22 ENCOUNTER — Emergency Department (HOSPITAL_BASED_OUTPATIENT_CLINIC_OR_DEPARTMENT_OTHER)

## 2024-11-22 ENCOUNTER — Observation Stay (HOSPITAL_BASED_OUTPATIENT_CLINIC_OR_DEPARTMENT_OTHER)
Admission: EM | Admit: 2024-11-22 | Discharge: 2024-11-24 | Disposition: A | Attending: Emergency Medicine | Admitting: Emergency Medicine

## 2024-11-22 ENCOUNTER — Other Ambulatory Visit: Payer: Self-pay

## 2024-11-22 DIAGNOSIS — E7132 Disorders of ketone metabolism: Secondary | ICD-10-CM | POA: Insufficient documentation

## 2024-11-22 DIAGNOSIS — F1010 Alcohol abuse, uncomplicated: Secondary | ICD-10-CM | POA: Diagnosis not present

## 2024-11-22 DIAGNOSIS — Z79899 Other long term (current) drug therapy: Secondary | ICD-10-CM | POA: Diagnosis not present

## 2024-11-22 DIAGNOSIS — I10 Essential (primary) hypertension: Secondary | ICD-10-CM | POA: Diagnosis not present

## 2024-11-22 DIAGNOSIS — R112 Nausea with vomiting, unspecified: Secondary | ICD-10-CM | POA: Diagnosis not present

## 2024-11-22 DIAGNOSIS — R109 Unspecified abdominal pain: Secondary | ICD-10-CM | POA: Diagnosis present

## 2024-11-22 DIAGNOSIS — E8729 Other acidosis: Principal | ICD-10-CM

## 2024-11-22 DIAGNOSIS — E8809 Other disorders of plasma-protein metabolism, not elsewhere classified: Secondary | ICD-10-CM | POA: Diagnosis present

## 2024-11-22 DIAGNOSIS — E162 Hypoglycemia, unspecified: Secondary | ICD-10-CM | POA: Diagnosis present

## 2024-11-22 DIAGNOSIS — E8889 Other specified metabolic disorders: Secondary | ICD-10-CM | POA: Diagnosis present

## 2024-11-22 LAB — COMPREHENSIVE METABOLIC PANEL WITH GFR
ALT: 28 U/L (ref 0–44)
AST: 52 U/L — ABNORMAL HIGH (ref 15–41)
Albumin: 5.4 g/dL — ABNORMAL HIGH (ref 3.5–5.0)
Alkaline Phosphatase: 59 U/L (ref 38–126)
Anion gap: 30 — ABNORMAL HIGH (ref 5–15)
BUN: 12 mg/dL (ref 6–20)
CO2: 10 mmol/L — ABNORMAL LOW (ref 22–32)
Calcium: 10.7 mg/dL — ABNORMAL HIGH (ref 8.9–10.3)
Chloride: 94 mmol/L — ABNORMAL LOW (ref 98–111)
Creatinine, Ser: 1.02 mg/dL — ABNORMAL HIGH (ref 0.44–1.00)
GFR, Estimated: >60 mL/min
Glucose, Bld: 93 mg/dL (ref 70–99)
Potassium: 4.4 mmol/L (ref 3.5–5.1)
Sodium: 134 mmol/L — ABNORMAL LOW (ref 135–145)
Total Bilirubin: 0.9 mg/dL (ref 0.0–1.2)
Total Protein: 10.4 g/dL — ABNORMAL HIGH (ref 6.5–8.1)

## 2024-11-22 LAB — BASIC METABOLIC PANEL WITH GFR
Anion gap: 25 — ABNORMAL HIGH (ref 5–15)
BUN: 11 mg/dL (ref 6–20)
CO2: 16 mmol/L — ABNORMAL LOW (ref 22–32)
Calcium: 10 mg/dL (ref 8.9–10.3)
Chloride: 96 mmol/L — ABNORMAL LOW (ref 98–111)
Creatinine, Ser: 0.89 mg/dL (ref 0.44–1.00)
GFR, Estimated: >60 mL/min
Glucose, Bld: 78 mg/dL (ref 70–99)
Potassium: 4.4 mmol/L (ref 3.5–5.1)
Sodium: 136 mmol/L (ref 135–145)

## 2024-11-22 LAB — CBC
HCT: 44.3 % (ref 36.0–46.0)
Hemoglobin: 15 g/dL (ref 12.0–15.0)
MCH: 32.6 pg (ref 26.0–34.0)
MCHC: 33.9 g/dL (ref 30.0–36.0)
MCV: 96.3 fL (ref 80.0–100.0)
Platelets: 209 K/uL (ref 150–400)
RBC: 4.6 MIL/uL (ref 3.87–5.11)
RDW: 13.8 % (ref 11.5–15.5)
WBC: 5 K/uL (ref 4.0–10.5)
nRBC: 0 % (ref 0.0–0.2)

## 2024-11-22 LAB — URINALYSIS, ROUTINE W REFLEX MICROSCOPIC
Glucose, UA: NEGATIVE mg/dL
Ketones, ur: >80 mg/dL — AB
Leukocytes,Ua: NEGATIVE
Nitrite: NEGATIVE
Protein, ur: >300 mg/dL — AB
Specific Gravity, Urine: 1.035 — ABNORMAL HIGH (ref 1.005–1.030)
pH: 6.5 (ref 5.0–8.0)

## 2024-11-22 LAB — I-STAT VENOUS BLOOD GAS, ED
Acid-base deficit: 8 mmol/L — ABNORMAL HIGH (ref 0.0–2.0)
Bicarbonate: 17.2 mmol/L — ABNORMAL LOW (ref 20.0–28.0)
Calcium, Ion: 1.24 mmol/L (ref 1.15–1.40)
HCT: 44 % (ref 36.0–46.0)
Hemoglobin: 15 g/dL (ref 12.0–15.0)
O2 Saturation: 58 %
Patient temperature: 99.6
Potassium: 4 mmol/L (ref 3.5–5.1)
Sodium: 134 mmol/L — ABNORMAL LOW (ref 135–145)
TCO2: 18 mmol/L — ABNORMAL LOW (ref 22–32)
pCO2, Ven: 35.5 mmHg — ABNORMAL LOW (ref 44–60)
pH, Ven: 7.296 (ref 7.25–7.43)
pO2, Ven: 34 mmHg (ref 32–45)

## 2024-11-22 LAB — LIPASE, BLOOD: Lipase: 33 U/L (ref 11–51)

## 2024-11-22 LAB — PREGNANCY, URINE: Preg Test, Ur: NEGATIVE

## 2024-11-22 MED ORDER — LACTATED RINGERS IV BOLUS
1000.0000 mL | Freq: Once | INTRAVENOUS | Status: AC
  Administered 2024-11-22: 1000 mL via INTRAVENOUS

## 2024-11-22 MED ORDER — PROCHLORPERAZINE MALEATE 10 MG PO TABS
10.0000 mg | ORAL_TABLET | Freq: Once | ORAL | Status: AC
  Administered 2024-11-22: 10 mg via ORAL
  Filled 2024-11-22: qty 1

## 2024-11-22 MED ORDER — THIAMINE HCL 100 MG/ML IJ SOLN
200.0000 mg | Freq: Every day | INTRAVENOUS | Status: DC
  Administered 2024-11-22 – 2024-11-24 (×3): 200 mg via INTRAVENOUS
  Filled 2024-11-22 (×3): qty 2

## 2024-11-22 MED ORDER — ONDANSETRON HCL 4 MG/2ML IJ SOLN
4.0000 mg | Freq: Once | INTRAMUSCULAR | Status: AC | PRN
  Administered 2024-11-22: 4 mg via INTRAVENOUS
  Filled 2024-11-22: qty 2

## 2024-11-22 MED ORDER — DEXTROSE 50 % IV SOLN
1.0000 | Freq: Once | INTRAVENOUS | Status: AC
  Administered 2024-11-22: 50 mL via INTRAVENOUS

## 2024-11-22 NOTE — ED Triage Notes (Signed)
 Pt via pov from home with emesis and  generalized abdominal pain x 4 days. Pt reports that she has not been able to keep down any food or water for that period of time. Pt a&o x 4; nad noted.

## 2024-11-22 NOTE — Progress Notes (Signed)
 Plan of Care Note for accepted transfer   Patient: Diane Dickson MRN: [id number]   DOA: 11/22/2024  Facility requesting transfer: [facility]   Requesting Provider: Lynwood Roosevelt, PA   Reason for transfer: Intractable N/V   Facility course: 29 yr old female with hx of HTN, depression, and alcohol abuse who presents with 4 days of nausea and vomiting.   She was hypertensive and tachycardic with increased creatinine and AGMA that improved with IVF. Urine pregnancy test is negative. UA notable for ketonuria, proteinuria, hematuria, and elevated specific gravity. Abdominal exam is benign per the ED PA.   She was treated with IVF, IV dextrose , thiamine , Zofran , and Compazine .   Plan of care: The patient is accepted for admission to Telemetry unit, at [facility].   Author: Evalene GORMAN Sprinkles, MD 11/22/2024  Check [email or web address] for on-call coverage.  Nursing staff, Please call [facility] Admits & Consults System-Wide number on Amion as soon as patient's arrival, so appropriate admitting provider can evaluate the pt.

## 2024-11-22 NOTE — ED Provider Notes (Signed)
 " [place] EMERGENCY DEPARTMENT AT [facility] Provider Note   CSN: [id number] Arrival date & time: 11/22/24  1652     Patient presents with: Abdominal Pain and Emesis   Diane Dickson is a 29 y.o. female history of alcohol abuse, bipolar, pneumomediastinum presents with persistent nausea and vomiting over the past few days.  Not associate with any diarrhea.  Has had some generalized abdominal pain from throwing up.  Denies any cannabis use.  No significant cough or URI symptoms.  No prior abdominal surgeries.  No urinary or vaginal symptoms.  Drinks approximately a pint of vodka a day.  Last drink was about 4 days ago before she got sick.    Abdominal Pain Associated symptoms: vomiting   Emesis Associated symptoms: abdominal pain    Past Medical History:  Diagnosis Date   Alcohol abuse    Bipolar 1 disorder (HCC)    Pneumomediastinum (HCC)    PTSD (post-traumatic stress disorder)    History reviewed. No pertinent surgical history.     Prior to Admission medications  Medication Sig Start Date End Date Taking? Authorizing Provider  escitalopram  (LEXAPRO ) 10 MG tablet Take 1 tablet (10 mg total) by mouth daily. 12/25/21   Mardy Elveria DEL, NP  hydrochlorothiazide  (HYDRODIURIL ) 12.5 MG tablet Take 1 tablet (12.5 mg total) by mouth daily. 12/25/21   Mardy Elveria DEL, NP  hydrOXYzine  (ATARAX ) 25 MG tablet Take 1 tablet (25 mg total) by mouth 3 (three) times daily as needed for anxiety. 12/25/21   Mardy Elveria DEL, NP  Multiple Vitamin (MULTIVITAMIN WITH MINERALS) TABS tablet Take 1 tablet by mouth daily. 03/03/22   Doda, Vandana, MD  ondansetron  (ZOFRAN -ODT) 4 MG disintegrating tablet Take 1 tablet (4 mg total) by mouth every 8 (eight) hours as needed for nausea or vomiting. 08/15/24   Zelaya, Oscar A, PA-C  thiamine  100 MG tablet Take 1 tablet (100 mg total) by mouth daily. 03/03/22   Doda, Vandana, MD  traZODone  (DESYREL ) 50 MG tablet Take 1 tablet (50 mg total) by mouth  at bedtime as needed for sleep. 12/25/21   Mardy Elveria DEL, NP    Allergies: Patient has no known allergies.    Review of Systems  Gastrointestinal:  Positive for abdominal pain and vomiting.    Updated Vital Signs BP (!) 165/107   Pulse 97   Temp 99.6 F (37.6 C) (Oral)   Resp 20   Ht 5' (1.524 m)   Wt 59 kg   LMP 11/03/2024 (Approximate)   SpO2 100%   BMI 25.40 kg/m   Physical Exam Vitals and nursing note reviewed.  Constitutional:      General: She is not in acute distress.    Appearance: She is well-developed.  HENT:     Head: Normocephalic and atraumatic.  Eyes:     Conjunctiva/sclera: Conjunctivae normal.  Cardiovascular:     Rate and Rhythm: Regular rhythm. Tachycardia present.     Heart sounds: No murmur heard. Pulmonary:     Effort: Pulmonary effort is normal. No respiratory distress.     Breath sounds: Normal breath sounds.  Abdominal:     Palpations: Abdomen is soft.     Tenderness: There is no abdominal tenderness.  Musculoskeletal:        General: No swelling.     Cervical back: Neck supple.  Skin:    General: Skin is warm and dry.     Capillary Refill: Capillary refill takes less than 2 seconds.  Neurological:  Mental Status: She is alert.  Psychiatric:        Mood and Affect: Mood normal.     (all labs ordered are listed, but only abnormal results are displayed) Labs Reviewed  COMPREHENSIVE METABOLIC PANEL WITH GFR - Abnormal; Notable for the following components:      Result Value   Sodium 134 (*)    Chloride 94 (*)    CO2 10 (*)    Creatinine, Ser 1.02 (*)    Calcium 10.7 (*)    Total Protein 10.4 (*)    Albumin 5.4 (*)    AST 52 (*)    Anion gap 30 (*)    All other components within normal limits  URINALYSIS, ROUTINE W REFLEX MICROSCOPIC - Abnormal; Notable for the following components:   Specific Gravity, Urine 1.035 (*)    Hgb urine dipstick MODERATE (*)    Bilirubin Urine SMALL (*)    Ketones, ur >80 (*)    Protein, ur  >300 (*)    Bacteria, UA RARE (*)    All other components within normal limits  BASIC METABOLIC PANEL WITH GFR - Abnormal; Notable for the following components:   Chloride 96 (*)    CO2 16 (*)    Anion gap 25 (*)    All other components within normal limits  I-STAT VENOUS BLOOD GAS, ED - Abnormal; Notable for the following components:   pCO2, Ven 35.5 (*)    Bicarbonate 17.2 (*)    TCO2 18 (*)    Acid-base deficit 8.0 (*)    Sodium 134 (*)    All other components within normal limits  LIPASE, BLOOD  CBC  PREGNANCY, URINE    EKG: EKG Interpretation Date/Time:  Saturday November 22 2024 19:49:49 EST Ventricular Rate:  100 PR Interval:  131 QRS Duration:  69 QT Interval:  341 QTC Calculation: 440 R Axis:   73  Text Interpretation: Sinus tachycardia Atrial premature complex Consider right atrial enlargement Confirmed by Yolande Charleston [phone]) on 11/22/2024 8:12:42 PM  Radiology: DG Chest Portable 1 View Result Date: 11/22/2024 EXAM: 1 VIEW(S) XRAY OF THE CHEST 11/22/2024 07:47:00 PM COMPARISON: 10/03/2024 CLINICAL HISTORY: hx of pneumomediastinum FINDINGS: LUNGS AND PLEURA: No focal pulmonary opacity. No pleural effusion. No pneumothorax. HEART AND MEDIASTINUM: No acute abnormality of the cardiac and mediastinal silhouettes. BONES AND SOFT TISSUES: No acute osseous abnormality. IMPRESSION: 1. No acute process. Electronically signed by: Morgane Naveau MD MD 11/22/2024 07:54 PM EST RP Workstation: [id number]     Procedures   Medications Ordered in the ED  thiamine  (VITAMIN B1) 200 mg in sodium chloride  0.9 % 50 mL IVPB (0 mg Intravenous Stopped 11/22/24 2229)  ondansetron  (ZOFRAN ) injection 4 mg (4 mg Intravenous Given 11/22/24 1728)  lactated ringers  bolus 1,000 mL (0 mLs Intravenous Stopped 11/22/24 2004)  prochlorperazine  (COMPAZINE ) tablet 10 mg (10 mg Oral Given 11/22/24 1959)  lactated ringers  bolus 1,000 mL (0 mLs Intravenous Stopped 11/22/24 2118)  dextrose  50 % solution 50  mL (50 mLs Intravenous Given 11/22/24 2151)    Clinical Course as of 11/22/24 2308  Sat Nov 22, 2024  1851 Lipase, blood Within normal limits [JT]  1851 Comprehensive metabolic panel(!) Anion gap of 30, bicarb of 10, will increase creatinine to 1.02, calcium 10.7, albumin 5.4 [JT]  1853 CBC Unremarkable [JT]  1853 Pregnancy, urine Negative [JT]  1853 Urinalysis, Routine w reflex microscopic -Urine, Unspecified Source(!) Elevated specific gravity with 80 ketones and 300 protein, negative nitrites, negative leukocytes [JT]  1940 Patient evaluated for multiple days of persistent nausea and vomiting without diarrhea or other URI symptoms.  Upon arrival patient is tachycardic, otherwise hemodynamically stable.  She has no focal abdominal tenderness.  Lab work is overall most notable for profound dehydration with metabolic acidosis.  Will provide IV fluids and antiemetics. [JT]  2000 DG Chest Portable 1 View No acute process or evidence of pneumomediastinum [JT]  2100 EKG 12-Lead [CP]  2222 Basic metabolic panel(!) Repeat BMP with anion gap improved down to 25 and bicarb improved up to 16 [JT]  2251 Patient reevaluated.  Still reporting persistent symptoms, does not feel comfortable going home.  Will pursue admission. [JT]  2305 Discussed patient with hospitalist, Dr. Charlton.  Agreed to admit for alcoholic ketoacidosis and failure to tolerate p.o. [JT]    Clinical Course User Index [CP] Primus, Clara J, Student-PA [JT] Donnajean Lynwood DEL, PA-C                                 Medical Decision Making Amount and/or Complexity of Data Reviewed Labs: ordered. Decision-making details documented in ED Course.  Risk Prescription drug management.   This patient presents to the ED with chief complaint(s) of nausea and vomiting .  The complaint involves an extensive differential diagnosis and also carries with it a high risk of complications and morbidity.   Pertinent past medical history as listed  in HPI  The differential diagnosis includes  Ketoacidosis, cannabis hyperemesis, appendicitis, cholecystitis, cystitis, pyelonephritis, nephrolithiasis Additional history obtained: Records reviewed Care Everywhere/External Records  Disposition:   Patient mated  Social Determinants of Health:   none  This note was dictated with voice recognition software.  Despite best efforts at proofreading, errors may have occurred which can change the documentation meaning.       Final diagnoses:  Alcoholic ketoacidosis    ED Discharge Orders     None          Donnajean Lynwood DEL, PA-C 11/22/24 2308  "

## 2024-11-23 ENCOUNTER — Encounter (HOSPITAL_COMMUNITY): Payer: Self-pay | Admitting: Internal Medicine

## 2024-11-23 DIAGNOSIS — R110 Nausea: Secondary | ICD-10-CM | POA: Diagnosis not present

## 2024-11-23 DIAGNOSIS — E162 Hypoglycemia, unspecified: Secondary | ICD-10-CM | POA: Diagnosis present

## 2024-11-23 DIAGNOSIS — I10 Essential (primary) hypertension: Secondary | ICD-10-CM | POA: Diagnosis not present

## 2024-11-23 DIAGNOSIS — E8729 Other acidosis: Secondary | ICD-10-CM | POA: Diagnosis present

## 2024-11-23 DIAGNOSIS — E8889 Other specified metabolic disorders: Secondary | ICD-10-CM | POA: Diagnosis present

## 2024-11-23 DIAGNOSIS — E8809 Other disorders of plasma-protein metabolism, not elsewhere classified: Secondary | ICD-10-CM | POA: Diagnosis present

## 2024-11-23 DIAGNOSIS — Z743 Need for continuous supervision: Secondary | ICD-10-CM | POA: Diagnosis not present

## 2024-11-23 DIAGNOSIS — R112 Nausea with vomiting, unspecified: Secondary | ICD-10-CM

## 2024-11-23 LAB — BASIC METABOLIC PANEL WITH GFR
Anion gap: 25 — ABNORMAL HIGH (ref 5–15)
Anion gap: 21 — ABNORMAL HIGH (ref 5–15)
BUN: 8 mg/dL (ref 6–20)
BUN: 7 mg/dL (ref 6–20)
CO2: 12 mmol/L — ABNORMAL LOW (ref 22–32)
CO2: 15 mmol/L — ABNORMAL LOW (ref 22–32)
Calcium: 9.8 mg/dL (ref 8.9–10.3)
Calcium: 9.5 mg/dL (ref 8.9–10.3)
Chloride: 98 mmol/L (ref 98–111)
Chloride: 97 mmol/L — ABNORMAL LOW (ref 98–111)
Creatinine, Ser: 0.63 mg/dL (ref 0.44–1.00)
Creatinine, Ser: 0.58 mg/dL (ref 0.44–1.00)
GFR, Estimated: >60 mL/min
GFR, Estimated: >60 mL/min
Glucose, Bld: 95 mg/dL (ref 70–99)
Glucose, Bld: 129 mg/dL — ABNORMAL HIGH (ref 70–99)
Potassium: 3.5 mmol/L (ref 3.5–5.1)
Potassium: 4.2 mmol/L (ref 3.5–5.1)
Sodium: 135 mmol/L (ref 135–145)
Sodium: 133 mmol/L — ABNORMAL LOW (ref 135–145)

## 2024-11-23 LAB — HIV ANTIBODY (ROUTINE TESTING W REFLEX): HIV Screen 4th Generation wRfx: NONREACTIVE

## 2024-11-23 LAB — GLUCOSE, CAPILLARY
Glucose-Capillary: 66 mg/dL — ABNORMAL LOW (ref 70–99)
Glucose-Capillary: 183 mg/dL — ABNORMAL HIGH (ref 70–99)
Glucose-Capillary: 118 mg/dL — ABNORMAL HIGH (ref 70–99)
Glucose-Capillary: 81 mg/dL (ref 70–99)

## 2024-11-23 LAB — PHOSPHORUS: Phosphorus: 1.7 mg/dL — ABNORMAL LOW (ref 2.5–4.6)

## 2024-11-23 LAB — MAGNESIUM: Magnesium: 2.1 mg/dL (ref 1.7–2.4)

## 2024-11-23 LAB — CBC
HCT: 40.2 % (ref 36.0–46.0)
Hemoglobin: 13.6 g/dL (ref 12.0–15.0)
MCH: 32.5 pg (ref 26.0–34.0)
MCHC: 33.8 g/dL (ref 30.0–36.0)
MCV: 96.2 fL (ref 80.0–100.0)
Platelets: 171 K/uL (ref 150–400)
RBC: 4.18 MIL/uL (ref 3.87–5.11)
RDW: 13.7 % (ref 11.5–15.5)
WBC: 4.4 K/uL (ref 4.0–10.5)
nRBC: 0 % (ref 0.0–0.2)

## 2024-11-23 LAB — CREATININE, SERUM
Creatinine, Ser: 0.64 mg/dL (ref 0.44–1.00)
GFR, Estimated: >60 mL/min

## 2024-11-23 MED ORDER — HYDROXYZINE HCL 25 MG PO TABS: 25.0000 mg | ORAL_TABLET | Freq: Three times a day (TID) | ORAL | Status: DC | PRN

## 2024-11-23 MED ORDER — PROCHLORPERAZINE EDISYLATE 10 MG/2ML IJ SOLN
10.0000 mg | Freq: Once | INTRAMUSCULAR | Status: AC
  Administered 2024-11-23: 10 mg via INTRAVENOUS
  Filled 2024-11-23: qty 2

## 2024-11-23 MED ORDER — ENOXAPARIN SODIUM 40 MG/0.4ML IJ SOSY
40.0000 mg | PREFILLED_SYRINGE | INTRAMUSCULAR | Status: DC
  Administered 2024-11-23: 40 mg via SUBCUTANEOUS
  Filled 2024-11-23: qty 0.4

## 2024-11-23 MED ORDER — HYDRALAZINE HCL 20 MG/ML IJ SOLN
10.0000 mg | INTRAMUSCULAR | Status: DC | PRN
  Filled 2024-11-23: qty 1

## 2024-11-23 MED ORDER — DEXTROSE 50 % IV SOLN: 12.5000 g | INTRAVENOUS | Status: DC | PRN

## 2024-11-23 MED ORDER — ENSURE PLUS HIGH PROTEIN PO LIQD
237.0000 mL | Freq: Two times a day (BID) | ORAL | Status: DC
  Administered 2024-11-24: 237 mL via ORAL

## 2024-11-23 MED ORDER — DEXTROSE 50 % IV SOLN
12.5000 g | Freq: Once | INTRAVENOUS | Status: AC
  Administered 2024-11-23: 12.5 g via INTRAVENOUS
  Filled 2024-11-23: qty 50

## 2024-11-23 MED ORDER — ESCITALOPRAM OXALATE 10 MG PO TABS
10.0000 mg | ORAL_TABLET | Freq: Every day | ORAL | Status: DC
  Administered 2024-11-24: 10 mg via ORAL
  Filled 2024-11-23 (×2): qty 1

## 2024-11-23 MED ORDER — POTASSIUM CHLORIDE 10 MEQ/100ML IV SOLN
10.0000 meq | INTRAVENOUS | Status: AC
  Administered 2024-11-23 (×4): 10 meq via INTRAVENOUS
  Filled 2024-11-23 (×4): qty 100

## 2024-11-23 MED ORDER — DEXTROSE 50 % IV SOLN
INTRAVENOUS | Status: AC
  Filled 2024-11-23: qty 50

## 2024-11-23 MED ORDER — ONDANSETRON HCL 4 MG/2ML IJ SOLN: 4.0000 mg | Freq: Four times a day (QID) | INTRAMUSCULAR | Status: DC | PRN

## 2024-11-23 MED ORDER — TRAZODONE HCL 50 MG PO TABS: 50.0000 mg | ORAL_TABLET | Freq: Every evening | ORAL | Status: DC | PRN

## 2024-11-23 MED ORDER — DEXTROSE 50 % IV SOLN
25.0000 g | Freq: Once | INTRAVENOUS | Status: AC
  Administered 2024-11-23: 25 g via INTRAVENOUS
  Filled 2024-11-23: qty 50

## 2024-11-23 MED ORDER — ONDANSETRON 4 MG PO TBDP: 4.0000 mg | ORAL_TABLET | Freq: Three times a day (TID) | ORAL | Status: DC | PRN

## 2024-11-23 MED ORDER — POTASSIUM PHOSPHATES 15 MMOLE/5ML IV SOLN
30.0000 mmol | Freq: Once | INTRAVENOUS | Status: AC
  Administered 2024-11-23: 30 mmol via INTRAVENOUS
  Filled 2024-11-23: qty 10

## 2024-11-23 MED ORDER — DEXTROSE 50 % IV SOLN
INTRAVENOUS | Status: AC
  Administered 2024-11-23: 50 mL
  Filled 2024-11-23: qty 50

## 2024-11-23 MED ORDER — DEXTROSE-SODIUM CHLORIDE 5-0.9 % IV SOLN: INTRAVENOUS | Status: AC

## 2024-11-23 MED ORDER — PROCHLORPERAZINE EDISYLATE 10 MG/2ML IJ SOLN: 10.0000 mg | Freq: Four times a day (QID) | INTRAMUSCULAR | Status: DC | PRN

## 2024-11-23 MED ORDER — HYDROCHLOROTHIAZIDE 12.5 MG PO TABS
12.5000 mg | ORAL_TABLET | Freq: Every day | ORAL | Status: DC
  Filled 2024-11-23: qty 1

## 2024-11-23 MED ORDER — PANTOPRAZOLE SODIUM 40 MG IV SOLR
40.0000 mg | Freq: Once | INTRAVENOUS | Status: AC
  Administered 2024-11-23: 40 mg via INTRAVENOUS
  Filled 2024-11-23: qty 10

## 2024-11-23 NOTE — Progress Notes (Addendum)
 Pt arrived to 5E bed 1516. BP is elevated 151/107 and CBG is 66. Dextrose  50% given. On call provider notified that pt has arrived. Pt is NPO  CIWA 2 for mild anxiety and nausea.   Recheck CBG 183   Pt states she feels a little better than when she arrived. RN awaiting admission orders.  Pt advanced to a reg diet and is tolerating PO intake without nausea and vomiting.

## 2024-11-23 NOTE — Plan of Care (Signed)
" °  Problem: Clinical Measurements: Goal: Ability to maintain clinical measurements within normal limits will improve Outcome: Progressing Goal: Diagnostic test results will improve Outcome: Progressing   Problem: Coping: Goal: Level of anxiety will decrease Outcome: Progressing   Problem: Nutrition: Goal: Adequate nutrition will be maintained Outcome: Not Progressing   "

## 2024-11-23 NOTE — Plan of Care (Signed)
 Electrolyte repletion completed today.  OOB with assistance.  New IV access x2 obtained by IV Team as patient is a very difficult stick.  Problem: Education: Goal: Knowledge of General Education information will improve Description: Including pain rating scale, medication(s)/side effects and non-pharmacologic comfort measures Outcome: Progressing   Problem: Health Behavior/Discharge Planning: Goal: Ability to manage health-related needs will improve Outcome: Progressing   Problem: Clinical Measurements: Goal: Ability to maintain clinical measurements within normal limits will improve Outcome: Progressing Goal: Will remain free from infection Outcome: Progressing Goal: Diagnostic test results will improve Outcome: Progressing

## 2024-11-23 NOTE — Progress Notes (Signed)
" °   11/23/24 1011  TOC Brief Assessment  Insurance and Status Reviewed  Patient has primary care physician Yes  Home environment has been reviewed single family home  Prior level of function: independent  Prior/Current Home Services No current home services  Social Drivers of Health Review SDOH reviewed no interventions necessary  Readmission risk has been reviewed Yes  Transition of care needs no transition of care needs at this time    Signed: Heather Saltness, MSW, LCSW Clinical Social Worker Inpatient Care Management 11/23/2024 10:11 AM   "

## 2024-11-23 NOTE — Progress Notes (Signed)
 Needing additional IV acces for multiple IV infusions. IV Team consult placed.     11/23/24 9077  Unsuccessful Nursing Procedure/Treatment  Type of Nursing Procedure/Treatment Peripheral IV insertion  Number of attempts 0  Location of attempt Assessed bilateral arms

## 2024-11-23 NOTE — H&P (Signed)
 " History and Physical    Patient: Diane Dickson FMW:[id number] DOB: [birth year] DOA: 11/22/2024 DOS: the patient was seen and examined on 11/23/2024 PCP: Donata Snowman, PA-C  Patient coming from: Home  Chief Complaint:  Chief Complaint  Patient presents with   Abdominal Pain   Emesis   HPI: Diane Dickson is a 29 y.o. female with medical history significant of alcohol abuse, bipolar disorder, PTSD, pneumomediastinum, hypertension, trichomonas, leukopenia, abnormal LFTs who presented the emergency department with complaints of abdominal pain, nausea and multiple episodes of emesis. No diarrhea, constipation, melena or hematochezia.  No flank pain, dysuria, frequency or hematuria.  She denied fever, chills, rhinorrhea, sore throat, wheezing or hemoptysis.  No chest pain, palpitations, diaphoresis, PND, orthopnea or pitting edema of the lower extremities.   No polyuria, polydipsia, polyphagia or blurred vision.   Lab work: Urinalysis shows specific gravity 1035, moderate hemoglobin, small bilirubin, greater than 80 ketones send greater than 300 mg deciliter protein with rare bacteria microscopic examination.  Urine pregnancy test was negative.  CBC and lipase level were normal.  CMP showed a 734, potassium 4.4, chloride 194 and CO2 10 mmol/L with an anion gap of 10.  Glucose 93, BUN 12 and creatinine 1.02 mg/dL.  Calcium normalizes after correction.  AST 52 units/L, total protein 7.4 and albumin 5.4 g/dL.  The rest of the LFTs were normal.  Phosphorus was 1.7 mg/dL.  Imaging: Portable 1 view chest radiograph with no acute process.  ED course: Initial vital signs were temperature 98.6 F, pulse 123, respirations 17, BP 154/117 mmHg O2 sat 100% on room air.  Patient received dextrose  25 g IVP, LR 2000 mL liter bolus, Zofran  4 mg IVP and Compazine  10 mg IVP.   Review of Systems: As mentioned in the history of present illness. All other systems reviewed and are negative. Past Medical History:   Diagnosis Date   Alcohol abuse    Bipolar 1 disorder (HCC)    Pneumomediastinum (HCC)    PTSD (post-traumatic stress disorder)    History reviewed. No pertinent surgical history. Social History:  reports that she has been smoking cigarettes. She has never used smokeless tobacco. She reports current alcohol use. She reports that she does not use drugs.  Allergies[1]  Family History  Problem Relation Age of Onset   Healthy Mother     Prior to Admission medications  Medication Sig Start Date End Date Taking? Authorizing Provider  escitalopram  (LEXAPRO ) 10 MG tablet Take 1 tablet (10 mg total) by mouth daily. 12/25/21   Mardy Elveria DEL, NP  hydrochlorothiazide  (HYDRODIURIL ) 12.5 MG tablet Take 1 tablet (12.5 mg total) by mouth daily. 12/25/21   Mardy Elveria DEL, NP  hydrOXYzine  (ATARAX ) 25 MG tablet Take 1 tablet (25 mg total) by mouth 3 (three) times daily as needed for anxiety. 12/25/21   Mardy Elveria DEL, NP  Multiple Vitamin (MULTIVITAMIN WITH MINERALS) TABS tablet Take 1 tablet by mouth daily. 03/03/22   Doda, Vandana, MD  ondansetron  (ZOFRAN -ODT) 4 MG disintegrating tablet Take 1 tablet (4 mg total) by mouth every 8 (eight) hours as needed for nausea or vomiting. 08/15/24   Zelaya, Oscar A, PA-C  thiamine  100 MG tablet Take 1 tablet (100 mg total) by mouth daily. 03/03/22   Doda, Vandana, MD  traZODone  (DESYREL ) 50 MG tablet Take 1 tablet (50 mg total) by mouth at bedtime as needed for sleep. 12/25/21   Mardy Elveria DEL, NP    Physical Exam: Vitals:   11/22/24 2155  11/23/24 0100 11/23/24 0207 11/23/24 0218  BP: (!) 165/107 (!) 143/87 (!) 151/107   Pulse: 97 (!) 101 99   Resp: 20 15 18    Temp:   99.6 F (37.6 C)   TempSrc:   Oral   SpO2: 100% 100%    Weight:    59.8 kg  Height:    5' (1.524 m)   Physical Exam Vitals and nursing note reviewed.  Constitutional:      Appearance: She is well-developed. She is ill-appearing.  HENT:     Head: Normocephalic.  Eyes:      General: No scleral icterus.    Pupils: Pupils are equal, round, and reactive to light.  Cardiovascular:     Rate and Rhythm: Normal rate and regular rhythm.  Pulmonary:     Effort: Pulmonary effort is normal.     Breath sounds: No wheezing, rhonchi or rales.  Abdominal:     General: Bowel sounds are normal.     Palpations: Abdomen is soft.     Tenderness: There is no abdominal tenderness.  Neurological:     Mental Status: She is alert.     Data Reviewed:  Results are pending, will review when available.  Assessment and Plan: Principal Problem:   Intractable nausea and vomiting In the setting of fasting:   Ketosis (HCC) Observation/MedSurg. Continue IV fluids. Keep n.p.o. for now. Advance diet as tolerated. Analgesics as needed. Antiemetics as needed. Pantoprazole  40 mg IVP's 1. Follow CBC, CMP in AM.  Active Problems:   Alcohol abuse Has not had alcohol in 4 to 5 days. No tremors or anxiety. - The patient was sleeping when I arrived to her room.    Hypertension Hydralazine  as needed. Follow-up with PCP for long-term management.    Hyperproteinemia Secondary to volume depletion. Continue IV fluids. Follow-up protein level in AM.    Hypoglycemia Continue D5 NS infusion. Dextrose  as needed.    Advance Care Planning:   Code Status: Full Code   Consults:   Family Communication:   Severity of Illness: The appropriate patient status for this patient is OBSERVATION. Observation status is judged to be reasonable and necessary in order to provide the required intensity of service to ensure the patient's safety. The patient's presenting symptoms, physical exam findings, and initial radiographic and laboratory data in the context of their medical condition is felt to place them at decreased risk for further clinical deterioration. Furthermore, it is anticipated that the patient will be medically stable for discharge from the hospital within 2 midnights of admission.    Author: Alm Dorn Castor, MD 11/23/2024 7:53 AM  For on call review [email or web address].   This document was prepared using Dragon voice recognition software and may contain some unintended transcription errors.     [1] No Known Allergies  "

## 2024-11-23 NOTE — Progress Notes (Signed)
 IV Team consult placed.  20g R  AC PIV was infusing Kphosphate. Pain noted with infusion. Infusion stopped.     11/23/24 1045  Peripheral IV 11/22/24 20 G Right Antecubital  Placement Date/Time: 11/22/24 1728   Size (Gauge): 20 G  Orientation: Right  Location: Antecubital  Site Prep: Chlorhexidine  (preferred);Skin Prep Completely Dry at the Time of First Skin Puncture  Person Inserting LDA: mary, rn  Insertion attempts: 1...  Site Assessment Painful  Line Status Infusing;Saline locked  Dressing Type Transparent  Dressing Status Clean, Dry, Intact  Interventions Cap changed  Dressing Change Due 11/29/24

## 2024-11-24 ENCOUNTER — Other Ambulatory Visit (HOSPITAL_COMMUNITY): Payer: Self-pay

## 2024-11-24 DIAGNOSIS — R112 Nausea with vomiting, unspecified: Secondary | ICD-10-CM | POA: Diagnosis not present

## 2024-11-24 LAB — BASIC METABOLIC PANEL WITH GFR
Anion gap: 13 (ref 5–15)
BUN: <5 mg/dL — ABNORMAL LOW (ref 6–20)
CO2: 22 mmol/L (ref 22–32)
Calcium: 9 mg/dL (ref 8.9–10.3)
Chloride: 101 mmol/L (ref 98–111)
Creatinine, Ser: 0.52 mg/dL (ref 0.44–1.00)
GFR, Estimated: >60 mL/min
Glucose, Bld: 87 mg/dL (ref 70–99)
Potassium: 3 mmol/L — ABNORMAL LOW (ref 3.5–5.1)
Sodium: 135 mmol/L (ref 135–145)

## 2024-11-24 LAB — HEPATIC FUNCTION PANEL
ALT: 20 U/L (ref 0–44)
AST: 31 U/L (ref 15–41)
Albumin: 3.8 g/dL (ref 3.5–5.0)
Alkaline Phosphatase: 39 U/L (ref 38–126)
Bilirubin, Direct: 0.3 mg/dL — ABNORMAL HIGH (ref 0.0–0.2)
Indirect Bilirubin: 0.4 mg/dL (ref 0.3–0.9)
Total Bilirubin: 0.7 mg/dL (ref 0.0–1.2)
Total Protein: 7.1 g/dL (ref 6.5–8.1)

## 2024-11-24 LAB — GLUCOSE, CAPILLARY
Glucose-Capillary: 118 mg/dL — ABNORMAL HIGH (ref 70–99)
Glucose-Capillary: 88 mg/dL (ref 70–99)
Glucose-Capillary: 89 mg/dL (ref 70–99)

## 2024-11-24 MED ORDER — POTASSIUM CHLORIDE CRYS ER 20 MEQ PO TBCR
40.0000 meq | EXTENDED_RELEASE_TABLET | Freq: Two times a day (BID) | ORAL | Status: DC
  Administered 2024-11-24: 40 meq via ORAL
  Filled 2024-11-24: qty 2

## 2024-11-24 MED ORDER — ONDANSETRON 4 MG PO TBDP
4.0000 mg | ORAL_TABLET | Freq: Three times a day (TID) | ORAL | 0 refills | Status: AC | PRN
  Filled 2024-11-24: qty 20, 7d supply, fill #0

## 2024-11-24 MED ORDER — DEXTROSE-SODIUM CHLORIDE 5-0.9 % IV SOLN: INTRAVENOUS | Status: DC

## 2024-11-24 MED ORDER — POTASSIUM CHLORIDE CRYS ER 20 MEQ PO TBCR
40.0000 meq | EXTENDED_RELEASE_TABLET | Freq: Every day | ORAL | 0 refills | Status: AC
  Filled 2024-11-24: qty 7, 3d supply, fill #0

## 2024-11-24 NOTE — Discharge Summary (Signed)
 Physician Discharge Summary  Diane Dickson FMW:[id number] DOB: 1995-12-15 DOA: 11/22/2024  PCP: Shepperson, Kirstin, PA-C  Admit date: 11/22/2024 Discharge date: 11/24/2024  Admitted From: Home Disposition: Home  Recommendations for Outpatient Follow-up:  Follow up with PCP in 1-2 weeks Please obtain BMP/CBC/magnesium /phosphorus in one week   Discharge Condition: Stable CODE STATUS: Full code Diet recommendation: Regular diet, soft diet, low-salt  Discharge summary: 29 year old with alcohol abuse, bipolar disorder, hypertension presented to the ER with abdominal pain nausea and multiple episodes of vomiting after binge alcohol drink 4 days ago.  In the emergency room she was hemodynamically stable but she had persistent symptoms so she was admitted for monitoring and IV fluids.  Potassium was 4.4, phosphorus was 1.7.  Electrolytes were adequate.  Treated overnight with IV fluids, gradually introduced food and now tolerating well.  She did not use any nausea medications since admission.  She tolerated soft diet.  Potassium is 3 otherwise electrolytes are stable.  Did not show any evidence of alcohol withdrawal.  Going home with nausea medications as needed, soft diet, counseled regarding alcohol cessation.  Resume blood pressure medications.  Intractable nausea vomiting likely secondary to alcohol induced gastritis.   Discharge Diagnoses:  Principal Problem:   Intractable nausea and vomiting Active Problems:   Alcohol abuse   Hypertension   Ketosis (HCC)   Hyperproteinemia   Hypoglycemia    Discharge Instructions  Discharge Instructions     Diet - low sodium heart healthy   Complete by: As directed    Increase activity slowly   Complete by: As directed       Allergies as of 11/24/2024   No Known Allergies      Medication List     STOP taking these medications    escitalopram  10 MG tablet Commonly known as: LEXAPRO    hydrochlorothiazide  12.5 MG tablet Commonly  known as: HYDRODIURIL        TAKE these medications    hydrOXYzine  25 MG tablet Commonly known as: ATARAX  Take 1 tablet (25 mg total) by mouth 3 (three) times daily as needed for anxiety.   lamoTRIgine 100 MG tablet Commonly known as: LAMICTAL Take 100 mg by mouth daily.   naltrexone 50 MG tablet Commonly known as: DEPADE Take 50 mg by mouth daily.   Olmesartan-amLODIPine-HCTZ 20-5-12.5 MG Tabs Take 1 tablet by mouth daily.   ondansetron  4 MG disintegrating tablet Commonly known as: ZOFRAN -ODT Take 1 tablet (4 mg total) by mouth every 8 (eight) hours as needed for nausea or vomiting.   potassium chloride  SA 20 MEQ tablet Commonly known as: KLOR-CON  M Take 2 tablets (40 mEq total) by mouth daily.   traZODone  50 MG tablet Commonly known as: DESYREL  Take 1 tablet (50 mg total) by mouth at bedtime as needed for sleep.        Allergies[1]  Consultations: None   Procedures/Studies: DG Chest Portable 1 View Result Date: 11/22/2024 EXAM: 1 VIEW(S) XRAY OF THE CHEST 11/22/2024 07:47:00 PM COMPARISON: 10/03/2024 CLINICAL HISTORY: hx of pneumomediastinum FINDINGS: LUNGS AND PLEURA: No focal pulmonary opacity. No pleural effusion. No pneumothorax. HEART AND MEDIASTINUM: No acute abnormality of the cardiac and mediastinal silhouettes. BONES AND SOFT TISSUES: No acute osseous abnormality. IMPRESSION: 1. No acute process. Electronically signed by: Morgane Naveau MD MD 11/22/2024 07:54 PM EST RP Workstation: [id number]   (Echo, Carotid, EGD, Colonoscopy, ERCP)    Subjective: Patient seen and examined.  Denies any complaints.  Eating without trouble.  Comfortable with plan to go home.  Discharge Exam: Vitals:   11/23/24 2101 11/24/24 0455  BP: (!) 134/97 (!) 135/99  Pulse: 82 (!) 108  Resp: 18 18  Temp: 98.3 F (36.8 C) 98.1 F (36.7 C)  SpO2: 100% 100%   Vitals:   11/23/24 0851 11/23/24 1354 11/23/24 2101 11/24/24 0455  BP: (!) 148/102 (!) 130/97 (!) 134/97 (!)  135/99  Pulse: 72 93 82 (!) 108  Resp:  17 18 18   Temp:  98.7 F (37.1 C) 98.3 F (36.8 C) 98.1 F (36.7 C)  TempSrc:  Oral Oral Oral  SpO2:  100% 100% 100%  Weight:      Height:        General: Pt is alert, awake, not in acute distress Flat affect.  Oriented x 4.  Looks pretty comfortable. Cardiovascular: RRR, S1/S2 +, no rubs, no gallops Respiratory: CTA bilaterally, no wheezing, no rhonchi Abdominal: Soft, NT, ND, bowel sounds + Extremities: no edema, no cyanosis    The results of significant diagnostics from this hospitalization (including imaging, microbiology, ancillary and laboratory) are listed below for reference.     Microbiology: No results found for this or any previous visit (from the past 240 hours).   Labs: BNP (last 3 results) No results for input(s): BNP in the last 8760 hours. Basic Metabolic Panel: Recent Labs  Lab 11/22/24 1727 11/22/24 2118 11/22/24 2232 11/23/24 0552 11/23/24 1337 11/24/24 0532  NA 134* 136 134* 135 133* 135  K 4.4 4.4 4.0 3.5 4.2 3.0*  CL 94* 96*  --  98 97* 101  CO2 10* 16*  --  12* 15* 22  GLUCOSE 93 78  --  95 129* 87  BUN 12 11  --  8 7 <5*  CREATININE 1.02* 0.89  --  0.63  0.64 0.58 0.52  CALCIUM 10.7* 10.0  --  9.8 9.5 9.0  MG  --   --   --  2.1  --   --   PHOS  --   --   --  1.7*  --   --    Liver Function Tests: Recent Labs  Lab 11/22/24 1727 11/24/24 0532  AST 52* 31  ALT 28 20  ALKPHOS 59 39  BILITOT 0.9 0.7  PROT 10.4* 7.1  ALBUMIN 5.4* 3.8   Recent Labs  Lab 11/22/24 1727  LIPASE 33   No results for input(s): AMMONIA in the last 168 hours. CBC: Recent Labs  Lab 11/22/24 1727 11/22/24 2232 11/23/24 0552  WBC 5.0  --  4.4  HGB 15.0 15.0 13.6  HCT 44.3 44.0 40.2  MCV 96.3  --  96.2  PLT 209  --  171   Cardiac Enzymes: No results for input(s): CKTOTAL, CKMB, CKMBINDEX, TROPONINI in the last 168 hours. BNP: Invalid input(s): POCBNP CBG: Recent Labs  Lab 11/23/24 1601  11/23/24 2057 11/24/24 0058 11/24/24 0453 11/24/24 0759  GLUCAP 118* 81 88 118* 89   D-Dimer No results for input(s): DDIMER in the last 72 hours. Hgb A1c No results for input(s): HGBA1C in the last 72 hours. Lipid Profile No results for input(s): CHOL, HDL, LDLCALC, TRIG, CHOLHDL, LDLDIRECT in the last 72 hours. Thyroid  function studies No results for input(s): TSH, T4TOTAL, T3FREE, THYROIDAB in the last 72 hours.  Invalid input(s): FREET3 Anemia work up No results for input(s): VITAMINB12, FOLATE, FERRITIN, TIBC, IRON, RETICCTPCT in the last 72 hours. Urinalysis    Component Value Date/Time   COLORURINE YELLOW 11/22/2024 1823   APPEARANCEUR CLEAR 11/22/2024 1823  LABSPEC 1.035 (H) 11/22/2024 1823   PHURINE 6.5 11/22/2024 1823   GLUCOSEU NEGATIVE 11/22/2024 1823   HGBUR MODERATE (A) 11/22/2024 1823   BILIRUBINUR SMALL (A) 11/22/2024 1823   KETONESUR >80 (A) 11/22/2024 1823   PROTEINUR >300 (A) 11/22/2024 1823   NITRITE NEGATIVE 11/22/2024 1823   LEUKOCYTESUR NEGATIVE 11/22/2024 1823   Sepsis Labs Recent Labs  Lab 11/22/24 1727 11/23/24 0552  WBC 5.0 4.4   Microbiology No results found for this or any previous visit (from the past 240 hours).   Time coordinating discharge: 28 minutes  SIGNED:   Renato Applebaum, MD  [facility] 11/24/2024, 1:18 PM     [1] No Known Allergies

## 2024-11-24 NOTE — Progress Notes (Signed)
 Discharge meds in a secure bag delivered to patient by this RN

## 2024-11-24 NOTE — Progress Notes (Signed)
 AVS reviewed with patient who verbalized an understanding. No other questions, Patient dressing for discharge to home. Ride called by patient- ok to wait in d/c area

## 2024-11-24 NOTE — Progress Notes (Signed)
 No overnight distress. PT states she feels better. Blood sugar remained stable and no nausea or vomiting noted overnight.
